# Patient Record
Sex: Male | Born: 1995 | Race: White | Hispanic: No | Marital: Single | Smoking: Never smoker
Health system: Southern US, Community
[De-identification: ages and names within clinical notes are randomized; demographics above are authoritative.]

## PROBLEM LIST (undated history)

## (undated) DIAGNOSIS — I82419 Acute embolism and thrombosis of unspecified femoral vein: Secondary | ICD-10-CM

## (undated) DIAGNOSIS — R760 Raised antibody titer: Secondary | ICD-10-CM

## (undated) HISTORY — PX: PENILE CYST REMOVAL: SHX2199

## (undated) HISTORY — DX: Acute embolism and thrombosis of unspecified femoral vein: I82.419

## (undated) HISTORY — DX: Raised antibody titer: R76.0

---
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
## 2014-06-21 ENCOUNTER — Encounter: Payer: Self-pay | Admitting: Family Medicine

## 2014-06-21 ENCOUNTER — Ambulatory Visit (HOSPITAL_COMMUNITY)
Admission: RE | Admit: 2014-06-21 | Discharge: 2014-06-21 | Disposition: A | Discharge status: Home / Self Care | Payer: BLUE CROSS/BLUE SHIELD | Source: Ambulatory Visit | Attending: Vascular Surgery | Admitting: Vascular Surgery

## 2014-06-21 ENCOUNTER — Ambulatory Visit (INDEPENDENT_AMBULATORY_CARE_PROVIDER_SITE_OTHER): Payer: BLUE CROSS/BLUE SHIELD | Admitting: Family Medicine

## 2014-06-21 VITALS — BP 135/80 | HR 88 | Temp 98.0°F | Resp 16 | Ht 73.0 in | Wt 177.0 lb

## 2014-06-21 DIAGNOSIS — R760 Raised antibody titer: Secondary | ICD-10-CM

## 2014-06-21 DIAGNOSIS — R21 Rash and other nonspecific skin eruption: Secondary | ICD-10-CM | POA: Diagnosis not present

## 2014-06-21 DIAGNOSIS — R7982 Elevated C-reactive protein (CRP): Secondary | ICD-10-CM | POA: Diagnosis not present

## 2014-06-21 DIAGNOSIS — M7989 Other specified soft tissue disorders: Secondary | ICD-10-CM

## 2014-06-21 DIAGNOSIS — M79605 Pain in left leg: Secondary | ICD-10-CM | POA: Diagnosis not present

## 2014-06-21 DIAGNOSIS — I82419 Acute embolism and thrombosis of unspecified femoral vein: Secondary | ICD-10-CM

## 2014-06-21 HISTORY — DX: Raised antibody titer: R76.0

## 2014-06-21 HISTORY — DX: Acute embolism and thrombosis of unspecified femoral vein: I82.419

## 2014-06-21 MED ORDER — APIXABAN 5 MG PO TABS: ORAL_TABLET | ORAL | Status: DC

## 2014-06-21 NOTE — Progress Notes (Signed)
Pre visit review using our clinic review tool, if applicable. No additional management support is needed unless otherwise documented below in the visit note. 

## 2014-06-21 NOTE — Progress Notes (Addendum)
Office Note 06/21/2014  CC:  Chief Complaint  Patient presents with  . Establish Care  . Leg Swelling    x 2 months, left leg, comes and go, no known injury   HPI:  Bryan Robbins is a 19 y.o. White male who is here with his father to establish care and discuss left leg swelling. Patient's most recent primary MD: [facility] pediatricians. Old records were not reviewed prior to or during today's visit.  He reports being in normal/well state of health until about 2 months ago when he began noticing left leg heaviness, crampy pain, easy fatigueability, from about the top of the left thigh all the way down into lower leg/calf.   He noted that the leg would swell significantly from top of thigh down into ankle if he walked for extended periods.  The leg felt firm to touch during these times.  Initially he noted no skin abnormalities, said there was no pitting when he pressed on his skin of his left leg.  No history of injury/trauma/strain to groin or left thigh/leg prior to onset of symptoms. No history of prolonged immobilization prior to the sx's, no recent surgical procedure, no personal or FH of DVT or PE.  He presented to his campus health clinic and was dx'd with "soft tissue injury/swelling" thought to be secondary to a possible injury while running a week or two prior.  However, he recalled no such injury. He was told to take a $Remo'325mg'avHrw$  aspirin once a day and he noted that the symptoms seemed to almost completely resolve after about a week of doing this.  However, he never felt like the leg completely returned to normal. Then, about 10 days ago his swelling and leg discomfort returned--and lately he has noted some skin changes. He has some scattered pinkish papules as well as some purplish/hyperpigmented macular lesions scattered on left upper and lower leg, primarily anterior aspect.   This prompted him to present to [facility] walk-in clinic 06/18/14.  Dx that was given is unclear to pt and  father, but he was rx'd a z pack and alleve bid was recommended.  He notes a small amount of improvement in the skin since getting on these meds, plus says a little of the swollen feeling in his left leg seems to have abated.    History reviewed. No pertinent past medical history. Healthy, no hospitalizations or procedures.  No chronic illnesses, no chronic meds. He has had appropriate WCCs growing up as well as appropriate vaccines per Dad's report today.  History reviewed. No pertinent past surgical history.  Family History  Problem Relation Age of Onset  . Hypertension Father   . Prostate cancer Paternal Uncle   . Lung cancer Maternal Grandmother   . Colon cancer Maternal Grandfather     History   Social History  . Marital Status: Single    Spouse Name: N/A  . Number of Children: N/A  . Years of Education: N/A   Occupational History  . Not on file.   Social History Main Topics  . Smoking status: Never Smoker   . Smokeless tobacco: Never Used  . Alcohol Use: Yes     Comment: occasionally  . Drug Use: No  . Sexual Activity: Not on file   Other Topics Concern  . Not on file   Social History Narrative   Single, no children.    student, junior-majoring in Ambulance person.   HS grad from [employer] in [place], [place].   No T/A/Ds.   MEDS: alleve 440 bid and azithromycin (has one more day of this med left to take)  Allergies  Allergen Reactions  . Penicillins Hives    ROS Review of Systems  Constitutional: Negative for fever, chills, appetite change and fatigue.  HENT: Negative for congestion, dental problem, ear pain, mouth sores, rhinorrhea and sore throat.   Eyes: Negative for discharge, redness and visual disturbance.  Respiratory: Negative for cough, chest tightness, shortness of breath and wheezing.   Cardiovascular: Negative for chest pain, palpitations and leg swelling.  Gastrointestinal: Negative for nausea, vomiting,  abdominal pain, diarrhea and blood in stool.  Genitourinary: Negative for dysuria, urgency, frequency, hematuria, flank pain, scrotal swelling and difficulty urinating.  Musculoskeletal: Negative for myalgias, back pain, joint swelling, arthralgias and neck stiffness.  Skin: Positive for rash (as per HPI). Negative for pallor.  Allergic/Immunologic: Negative for immunocompromised state.  Neurological: Negative for dizziness, speech difficulty, weakness and headaches.  Hematological: Negative for adenopathy. Does not bruise/bleed easily.  Psychiatric/Behavioral: Negative for confusion and sleep disturbance. The patient is not nervous/anxious.     PE; Blood pressure 135/80, pulse 88, temperature 98 F (36.7 C), temperature source Oral, resp. rate 16, height $RemoveBe'6\' 1"'EJlSFydJm$  (1.854 m), weight 177 lb (80.287 kg), SpO2 100 %. Gen: Alert, well appearing.  Patient is oriented to person, place, time, and situation. ZOX:WRUE: no injection, icteris, swelling, or exudate.  EOMI, PERRLA. Mouth: lips without lesion/swelling.  Oral mucosa pink and moist. Oropharynx without erythema, exudate, or swelling.  Neck - No masses or thyromegaly or limitation in range of motion CV: RRR, no m/r/g.   LUNGS: CTA bilat, nonlabored resps, good aeration in all lung fields. ABD: soft, NT, ND, BS normal.  No hepatospenomegaly or mass.  No bruits. Genitals normal; both testes normal without tenderness, masses, hydroceles, varicoceles, erythema or swelling. Shaft normal, circumcised, meatus normal without discharge. No inguinal hernia noted. No inguinal lymphadenopathy. Femoral pulses 2+ bilat.  EXT: Right LE w/out c/c/e or rash. R calf circ at 28 cm below inf border of patella is 27.5 cm L calf circ at 28 cm below inf border of patella is 30 cm. Left LE with no pitting edema and no obvious enlargement/swelling on general inspection. Knee and hip ROM fully intact and both of these joints are w/out tenderness.  No knee erythema,  warmth, or effusion.  LL's with 5/5 strength prox and dist bilat.  Patellar DTRs 2+ bilat and achilles DTRs 1+ bilat. No varicosities or areas of streaking or inflammation are noted on L leg. The skin of the left leg does have a dozen or so scattered pink papules, a couple of tiny pustules, and 15-20 oval-to-irregular shaped macular lesions that are non-blanching for the most part.  Some of the macules seem to blanch.  No palpable purpura.  No gluteal rash/lesions. Possibly a bit of pitting edema in left ankle but no ankle tenderness, and no sign of peri-articular inflammation or arthritis. Both feet feel a bit cool to touch, particularly on bottoms.   Pertinent labs:  Review of labs done at [facility] walkin clinic 06/18/14 show CMET all normal and CRP of 39.2.  ASSESSMENT AND PLAN:   New pt: will obtain appropriate prior PCP records.  1) Subacute left leg swelling with nonspecific skin eruption on the left leg. Certainly a perplexing case, esp since his leg exam here in the office today is pretty unremarkable except for the skin changes.  Will certainly check left  leg venous duplex doppler u/s ASAP to further eval for DVT. Additionally, given the skin changes and recent elevated CRP, I want to check several other lab tests to see if he has any sign of systemic inflammation such as a vasculitis (? HSP). Check CBC w/diff, CMET, CRP, ESR, ANA, d dimer, and UA with reflex microscopy.  He has an appt set up already to see a dermatologist tomorrow (Dr. Tonia Brooms in [place]), so I will defer the punch biopsy I had planned on doing today.    An After Visit Summary was printed and given to the patient.  Spent 50 min with pt today, with >50% of this time spent in counseling and care coordination regarding the above problems.  F/u: To be determined based on results of pending workup.  ADDENDUM 06/22/14: Ultrasound showed left leg proximal DVT. After phone consultation with Dr. Curt Jews with Vascular  surgery, I started patient on outpatient anticoagulation therapy: Eliquis rx'd and pt took first dose last evening.--PM

## 2014-06-22 LAB — COMPREHENSIVE METABOLIC PANEL WITH GFR
ALT: 14 U/L (ref 0–53)
AST: 14 U/L (ref 0–37)
Albumin: 4.7 g/dL (ref 3.5–5.2)
Alkaline Phosphatase: 100 U/L (ref 39–117)
BUN: 14 mg/dL (ref 6–23)
CO2: 30 meq/L (ref 19–32)
Calcium: 9.9 mg/dL (ref 8.4–10.5)
Chloride: 102 meq/L (ref 96–112)
Creatinine, Ser: 1.09 mg/dL (ref 0.40–1.50)
GFR: 91.31 mL/min
Glucose, Bld: 95 mg/dL (ref 70–99)
Potassium: 4.7 meq/L (ref 3.5–5.1)
Sodium: 139 meq/L (ref 135–145)
Total Bilirubin: 0.4 mg/dL (ref 0.2–1.2)
Total Protein: 8.3 g/dL (ref 6.0–8.3)

## 2014-06-22 LAB — CBC WITH DIFFERENTIAL/PLATELET
Basophils Absolute: 0 10*3/uL (ref 0.0–0.1)
Basophils Relative: 0.3 % (ref 0.0–3.0)
EOS ABS: 0.1 10*3/uL (ref 0.0–0.7)
Eosinophils Relative: 1.4 % (ref 0.0–5.0)
HEMATOCRIT: 42.8 % (ref 39.0–52.0)
Hemoglobin: 14.1 g/dL (ref 13.0–17.0)
LYMPHS PCT: 18.3 % (ref 12.0–46.0)
Lymphs Abs: 1.9 10*3/uL (ref 0.7–4.0)
MCHC: 32.9 g/dL (ref 30.0–36.0)
MCV: 89.8 fl (ref 78.0–100.0)
MONOS PCT: 3 % (ref 3.0–12.0)
Monocytes Absolute: 0.3 10*3/uL (ref 0.1–1.0)
NEUTROS ABS: 7.9 10*3/uL — AB (ref 1.4–7.7)
Neutrophils Relative %: 77 % (ref 43.0–77.0)
PLATELETS: 243 10*3/uL (ref 150.0–400.0)
RBC: 4.77 Mil/uL (ref 4.22–5.81)
RDW: 13.6 % (ref 11.5–14.6)
WBC: 10.3 10*3/uL (ref 4.5–10.5)

## 2014-06-22 LAB — URINALYSIS, ROUTINE W REFLEX MICROSCOPIC
Bilirubin Urine: NEGATIVE
Hgb urine dipstick: NEGATIVE
Ketones, ur: NEGATIVE
Leukocytes, UA: NEGATIVE
Nitrite: NEGATIVE
Specific Gravity, Urine: <=1.005 — AB
Total Protein, Urine: NEGATIVE
Urine Glucose: NEGATIVE
Urobilinogen, UA: 0.2
pH: 7 (ref 5.0–8.0)

## 2014-06-22 LAB — C-REACTIVE PROTEIN: CRP: 3.5 mg/dL (ref 0.5–20.0)

## 2014-06-22 LAB — D-DIMER, QUANTITATIVE (NOT AT ARMC): D DIMER QUANT: 0.45 ug{FEU}/mL (ref 0.00–0.48)

## 2014-06-22 LAB — SEDIMENTATION RATE: SED RATE: 29 mm/h — AB (ref 0–22)

## 2014-06-23 ENCOUNTER — Other Ambulatory Visit: Payer: Self-pay | Admitting: Family Medicine

## 2014-06-23 ENCOUNTER — Telehealth: Payer: Self-pay

## 2014-06-23 DIAGNOSIS — I82402 Acute embolism and thrombosis of unspecified deep veins of left lower extremity: Secondary | ICD-10-CM

## 2014-06-23 LAB — RHEUMATOID FACTOR

## 2014-06-23 NOTE — Telephone Encounter (Signed)
[facility] lab called stating that they didn't receive a specimen to run the Rheumatoid Factor on his blood work.

## 2014-06-23 NOTE — Telephone Encounter (Signed)
I was aware

## 2014-06-26 ENCOUNTER — Telehealth: Payer: Self-pay | Admitting: Family Medicine

## 2014-06-26 NOTE — Telephone Encounter (Signed)
Copy of labs put up front for p/u.

## 2014-06-26 NOTE — Telephone Encounter (Signed)
Patient's father would like to pick up a copy of patient's bloodwork results

## 2014-06-28 ENCOUNTER — Telehealth: Payer: Self-pay | Admitting: Hematology & Oncology

## 2014-06-28 NOTE — Telephone Encounter (Signed)
I spoke w NEW PATIENT father (Daniel) todReuel Boomay to remind them of their appointment with Dr. Myna HidalgoEnnever. Also, advised them to bring all medication bottles and insurance card information.

## 2014-06-30 ENCOUNTER — Other Ambulatory Visit: Payer: Self-pay | Admitting: *Deleted

## 2014-06-30 DIAGNOSIS — I82409 Acute embolism and thrombosis of unspecified deep veins of unspecified lower extremity: Secondary | ICD-10-CM

## 2014-07-05 ENCOUNTER — Ambulatory Visit: Payer: BLUE CROSS/BLUE SHIELD

## 2014-07-05 ENCOUNTER — Encounter: Payer: Self-pay | Admitting: Family

## 2014-07-05 ENCOUNTER — Ambulatory Visit (HOSPITAL_BASED_OUTPATIENT_CLINIC_OR_DEPARTMENT_OTHER): Payer: BLUE CROSS/BLUE SHIELD | Admitting: Family

## 2014-07-05 VITALS — BP 124/69 | HR 120 | Temp 98.3°F | Resp 18 | Ht 73.0 in | Wt 177.0 lb

## 2014-07-05 DIAGNOSIS — I82422 Acute embolism and thrombosis of left iliac vein: Secondary | ICD-10-CM

## 2014-07-05 DIAGNOSIS — I8290 Acute embolism and thrombosis of unspecified vein: Secondary | ICD-10-CM

## 2014-07-05 DIAGNOSIS — I82409 Acute embolism and thrombosis of unspecified deep veins of unspecified lower extremity: Secondary | ICD-10-CM

## 2014-07-05 NOTE — Progress Notes (Signed)
Hematology/Oncology Consultation   Name: Bryan Robbins      MRN: [id number]    Location: Room/bed info not found  Date: 07/05/2014 Time:9:00 AM   REFERRING PHYSICIAN: Jeoffrey Massed, MD  REASON FOR CONSULT: DVT of the left lower extremity   DIAGNOSIS: DVT of the left lower extremity  HISTORY OF PRESENT ILLNESS: 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 This is his first blood clots. He started noticing swelling and "heaviness" in his left leg 3 months ago. He had just returned from [place] in a cramped car with a bunch of his friends. An [place] on 6/1 confirmed the DVT and he was started on Eliquis.  He has had no episodes of bruising or bleeding while on anticoagulation. The swelling in the left leg has resolved and he has not experienced any pain.  There is no family history of blood clots. However, his mother did have to miscarriages. We checked a hypercoagulable panel on him today and are awaiting results.  He does not smoke, drink heavily or do recreational drugs.  No cancer history.  He has had no problem with infections. No fever, chills, n/v, cough, rash, dizziness, blurred vision, headaches, SOB, chest pain, palpitations, abdominal pain, change in bowel or bladder habits, blood in urine or stool.  No numbness or tingling in his extremities.No new aches or pains.  His appetite is good and he is staying well hydrated. No weight loss or gain.  He is a Consulting civil engineer at [employer]. He is active running and doing various other outdoor activities.   ROS: All other 10 point review of systems is negative.   PAST MEDICAL HISTORY:   No past medical history on file.  ALLERGIES: Allergies  Allergen Reactions  . Penicillins Hives      MEDICATIONS:  Current Outpatient Prescriptions on File Prior to Visit  Medication Sig Dispense Refill  . apixaban (ELIQUIS) 5 MG TABS  tablet 2 tabs po bid x 7d, then 1 tab po bid 70 tablet 0  . azithromycin (ZITHROMAX) 250 MG tablet Take 250 mg by mouth. Take 2 tablet the first day then take 1 tablet til finished    . naproxen sodium (ANAPROX) 220 MG tablet Take 220 mg by mouth 2 (two) times daily with a meal.     No current facility-administered medications on file prior to visit.     PAST SURGICAL HISTORY No past surgical history on file.  FAMILY HISTORY: Family History  Problem Relation Age of Onset  . Hypertension Father   . Prostate cancer Paternal Uncle   . Lung cancer Maternal Grandmother   . Colon cancer Maternal Grandfather     SOCIAL HISTORY:  reports that he has never smoked. He has never used smokeless tobacco. He reports that he drinks alcohol. He reports that he does not use illicit drugs.  PERFORMANCE STATUS: The patient's performance status is 1 - Symptomatic but completely ambulatory  PHYSICAL EXAM: Most Recent Vital Signs: There were no vitals taken for this visit. BP 124/69 mmHg  Pulse 120  Temp(Src) 98.3 F (36.8 C) (Oral)  Resp 18  Ht  (1.854 m)  Wt 177 lb (80.287 kg)  BMI 23.36 kg/m2  General Appearance:    Alert, cooperative, no distress, appears stated age  Head:    Normocephalic, without obvious abnormality, atraumatic  Eyes:    PERRL,  conjunctiva/corneas clear, EOM's intact, fundi    benign, both eyes             Throat:   Lips, mucosa, and tongue normal; teeth and gums normal  Neck:   Supple, symmetrical, trachea midline, no adenopathy;       thyroid:  No enlargement/tenderness/nodules; no carotid   bruit or JVD  Back:     Symmetric, no curvature, ROM normal, no CVA tenderness  Lungs:     Clear to auscultation bilaterally, respirations unlabored  Chest wall:    No tenderness or deformity  Heart:    Regular rate and rhythm, S1 and S2 normal, no murmur, rub   or gallop  Abdomen:     Soft, non-tender, bowel sounds active all four quadrants,    no masses, no  organomegaly        Extremities:   Extremities normal, atraumatic, no cyanosis or edema  Pulses:   2+ and symmetric all extremities  Skin:   Skin color, texture, turgor normal, no rashes or lesions  Lymph nodes:   Cervical, supraclavicular, and axillary nodes normal  Neurologic:   CNII-XII intact. Normal strength, sensation and reflexes      throughout   LABORATORY DATA:  No results found for this or any previous visit (from the past 48 hour(s)).    RADIOGRAPHY: No results found.     PATHOLOGY: None  ASSESSMENT/PLAN:  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 This is his first blood clot diagnosed on 6/1 by [place]. He is currently on Eliquis BID and has noticed a significant decrease in swelling and discomfort.  He had no bruising or episodes of bleeding while on anticoagulation.  We will see what his hypercoagulable panel shows. It is interesting that his mother had 2 miscarriages. This may be related.  He may also have May -Thurner Syndrome with him being so young and the DVT having appeared in the left leg. We will contact radiology and find the least invasive method to assess for this.  We will set up his follow-up after receiving his lab results from today.  All questions were answered. Both he and his father know to contact us with any problems, questions or concerns. We can certainly see him much sooner if necessary.  The patient was discussed with and also seen by Dr. Myna Hidalgo and he is in agreement with the aforementioned.   [facility]    Addendum:  I saw and examined the patient with Zeth Buday. I do find it very interesting that his mother had 2 miscarriages. That certainly could indicate some hereditary hypercoagulable condition. We will see what his hypercoagulable panel shows.  I also need to evaluate him for iliac vein compression due to the May-Thurner syndrome. With a  young patient like [name], this would be a possible etiology for a thromboembolic event in the left leg.  I cannot see anything on his blood smear that looked suspicious.  We spent about 45 minutes with he and his dad. They're both very nice.  We will set him up with a CT venogram of the abdomen and pelvis. I spoke with one of the radiologist who gave me the recommendation for doing the venogram to identify any iliac vein compression.  We will probably get him back to see [place] in about 4-6 weeks area did repot need to make sure we get  him back for a goes back to college at [employer]  Cindee Lame

## 2014-07-06 ENCOUNTER — Telehealth: Payer: Self-pay | Admitting: Hematology & Oncology

## 2014-07-06 NOTE — Telephone Encounter (Signed)
Spoke with pt's fathers regarding ct appt date, time, and instructions. Father confirmed pick up of contrast and appt.

## 2014-07-09 LAB — HYPERCOAGULABLE PANEL, COMPREHENSIVE
AntiThromb III Func: 105 % (ref 76–126)
Anticardiolipin IgA: 5 APL U/mL (ref <22)
Anticardiolipin IgG: 15 GPL U/mL (ref <23)
Anticardiolipin IgM: 2 MPL U/mL (ref <11)
BETA-2-GLYCOPROTEIN I IGM: 6 M Units (ref <20)
Beta-2 Glyco I IgG: 5 G Units (ref <20)
Beta-2-Glycoprotein I IgA: 17 A Units (ref <20)
DRVVT 1 1 MIX: 44.2 s — AB (ref <42.9)
DRVVT: 60.9 secs — ABNORMAL HIGH (ref <42.9)
Drvvt confirmation: 1.23 Ratio — ABNORMAL HIGH (ref <1.15)
Lupus Anticoagulant: DETECTED — AB
PROTEIN C ACTIVITY: 120 % (ref 75–133)
PROTEIN C, TOTAL: 77 % (ref 72–160)
PTT Lupus Anticoagulant: 47.5 secs — ABNORMAL HIGH (ref 28.0–43.0)
PTTLA 41 MIX: 47.9 s — AB (ref 28.0–43.0)
PTTLA CONFIRMATION: 5.4 s (ref <8.0)
Protein S Activity: 120 % (ref 69–129)
Protein S Total: 93 % (ref 60–150)

## 2014-07-09 LAB — ANTI-DNA ANTIBODY, DOUBLE-STRANDED: ds DNA Ab: 4 IU/mL

## 2014-07-09 LAB — ANA: ANA: NEGATIVE

## 2014-07-09 LAB — ANTI-SMITH ANTIBODY: ENA SM Ab Ser-aCnc: <1

## 2014-07-10 ENCOUNTER — Encounter (HOSPITAL_BASED_OUTPATIENT_CLINIC_OR_DEPARTMENT_OTHER): Payer: Self-pay

## 2014-07-10 ENCOUNTER — Ambulatory Visit (HOSPITAL_BASED_OUTPATIENT_CLINIC_OR_DEPARTMENT_OTHER)
Admission: RE | Admit: 2014-07-10 | Discharge: 2014-07-10 | Disposition: A | Discharge status: Home / Self Care | Payer: BLUE CROSS/BLUE SHIELD | Source: Ambulatory Visit | Attending: Hematology & Oncology | Admitting: Hematology & Oncology

## 2014-07-10 ENCOUNTER — Other Ambulatory Visit: Payer: Self-pay | Admitting: Hematology & Oncology

## 2014-07-10 DIAGNOSIS — I82412 Acute embolism and thrombosis of left femoral vein: Secondary | ICD-10-CM | POA: Diagnosis not present

## 2014-07-10 DIAGNOSIS — I82422 Acute embolism and thrombosis of left iliac vein: Secondary | ICD-10-CM | POA: Diagnosis present

## 2014-07-10 DIAGNOSIS — I8290 Acute embolism and thrombosis of unspecified vein: Secondary | ICD-10-CM

## 2014-07-10 MED ORDER — IOHEXOL 350 MG/ML SOLN
100.0000 mL | Freq: Once | INTRAVENOUS | Status: AC | PRN
  Administered 2014-07-10: 100 mL via INTRAVENOUS

## 2014-07-12 ENCOUNTER — Telehealth: Payer: Self-pay | Admitting: *Deleted

## 2014-07-12 NOTE — Telephone Encounter (Addendum)
Patient aware of results.  ----- Message from Josph Macho, MD sent at 07/11/2014  5:13 PM EDT ----- Call - CT angiogram does not show any May-Thurner syndrome abnormality.  The iliac vein is slightly compressed, but not significant.  They actually saw very thrombus in the left femoral vein!!!   Bryan Robbins

## 2014-07-13 LAB — HEXAGONAL PHOSPHOLIPID NEUTRALIZATION: HEX PHOSPH NEUT TEST: POSITIVE — AB

## 2014-07-14 ENCOUNTER — Telehealth: Payer: Self-pay | Admitting: Family

## 2014-07-14 ENCOUNTER — Other Ambulatory Visit: Payer: Self-pay | Admitting: Family

## 2014-07-14 MED ORDER — APIXABAN 5 MG PO TABS: 5.0000 mg | ORAL_TABLET | Freq: Two times a day (BID) | ORAL | Status: DC

## 2014-07-14 NOTE — Telephone Encounter (Signed)
I spoke with the patient's father, Bryan Robbins, over the phone and went over his blood work and scan results. We discussed the positive lupus anticoagulant and how it effect's Bryan Robbins's tendency to clot. I refilled his Eliquis prescription. I will check with Dr. Myna Hidalgo on Monday and see when he would like a follow-up.

## 2014-07-18 ENCOUNTER — Telehealth: Payer: Self-pay | Admitting: Family

## 2014-07-18 ENCOUNTER — Other Ambulatory Visit: Payer: Self-pay | Admitting: Family

## 2014-07-18 DIAGNOSIS — I82402 Acute embolism and thrombosis of unspecified deep veins of left lower extremity: Secondary | ICD-10-CM

## 2014-07-18 NOTE — Telephone Encounter (Signed)
Just spoke with Bryan Robbins over the phone and let him know that Dr. Myna HidalgoEnnever would also like him to be taking an 81 mg baby aspirin daily along with the Eliquis. We will set him up for a follow-up in 1 month.

## 2014-07-19 ENCOUNTER — Telehealth: Payer: Self-pay | Admitting: Hematology & Oncology

## 2014-07-19 NOTE — Telephone Encounter (Signed)
Called and could not leave message regarding future July appt mailed calendar

## 2014-07-20 ENCOUNTER — Telehealth: Payer: Self-pay

## 2014-07-20 NOTE — Telephone Encounter (Signed)
Gaston Islamaniel Pauli Dad  [facility]

## 2014-07-31 ENCOUNTER — Telehealth: Payer: Self-pay | Admitting: *Deleted

## 2014-07-31 ENCOUNTER — Other Ambulatory Visit: Payer: Self-pay | Admitting: *Deleted

## 2014-07-31 DIAGNOSIS — R0602 Shortness of breath: Secondary | ICD-10-CM

## 2014-07-31 NOTE — Telephone Encounter (Signed)
Patient called stating that he has been having right sided chest tightness. It is constant and is described as an achy pain.  Dr. Myna HidalgoEnnever ordered CT angiogram.  Orders placed .  Patient notified

## 2014-08-01 ENCOUNTER — Ambulatory Visit (HOSPITAL_BASED_OUTPATIENT_CLINIC_OR_DEPARTMENT_OTHER)
Admission: RE | Admit: 2014-08-01 | Discharge: 2014-08-01 | Disposition: A | Discharge status: Home / Self Care | Payer: BLUE CROSS/BLUE SHIELD | Source: Ambulatory Visit | Attending: Hematology & Oncology | Admitting: Hematology & Oncology

## 2014-08-01 ENCOUNTER — Encounter (HOSPITAL_BASED_OUTPATIENT_CLINIC_OR_DEPARTMENT_OTHER): Payer: Self-pay

## 2014-08-01 DIAGNOSIS — R0602 Shortness of breath: Secondary | ICD-10-CM

## 2014-08-01 DIAGNOSIS — R079 Chest pain, unspecified: Secondary | ICD-10-CM | POA: Diagnosis not present

## 2014-08-01 MED ORDER — IOHEXOL 350 MG/ML SOLN
100.0000 mL | Freq: Once | INTRAVENOUS | Status: AC | PRN
  Administered 2014-08-01: 100 mL via INTRAVENOUS

## 2014-08-02 ENCOUNTER — Encounter: Payer: Self-pay | Admitting: Family Medicine

## 2014-08-02 ENCOUNTER — Telehealth: Payer: Self-pay | Admitting: *Deleted

## 2014-08-02 ENCOUNTER — Ambulatory Visit (INDEPENDENT_AMBULATORY_CARE_PROVIDER_SITE_OTHER): Payer: BLUE CROSS/BLUE SHIELD | Admitting: Family Medicine

## 2014-08-02 VITALS — BP 136/78 | HR 86 | Temp 97.4°F | Resp 16 | Ht 73.0 in | Wt 178.0 lb

## 2014-08-02 DIAGNOSIS — I82412 Acute embolism and thrombosis of left femoral vein: Secondary | ICD-10-CM | POA: Diagnosis not present

## 2014-08-02 DIAGNOSIS — R072 Precordial pain: Secondary | ICD-10-CM

## 2014-08-02 DIAGNOSIS — I82419 Acute embolism and thrombosis of unspecified femoral vein: Secondary | ICD-10-CM | POA: Insufficient documentation

## 2014-08-02 MED ORDER — RANITIDINE HCL 150 MG PO CAPS: 150.0000 mg | ORAL_CAPSULE | Freq: Two times a day (BID) | ORAL | Status: DC

## 2014-08-02 NOTE — Progress Notes (Signed)
Pre visit review using our clinic review tool, if applicable. No additional management support is needed unless otherwise documented below in the visit note. 

## 2014-08-02 NOTE — Progress Notes (Signed)
OFFICE NOTE  08/02/2014  CC:  Chief Complaint  Patient presents with  . Chest Pain    pressure right side of chest x 6 days   HPI: Patient is a 19 y.o. Caucasian male who is here for chest pressure. Onset about 6d/a, feels a pressure on R upper pectoral area, worse with deep breath, feels like he has trouble getting a deep inspiration in.  No SOB or rapid breathing, no cough or fevers or URI sx's.  He started ASA 81mg  qd 2 wks ago and drinks glass of water with it but takes it on empty stomach almost every time.  No other NSAIDs.  He notified Dr. Myna HidalgoEnnever of these sx's and he got a CT chest angio on 08/01/14 and this showed NO PE or other abnormality.  Has had some heartburn/reflux type sx's a few times during the last week. A bit more need to clear some mucous from his throat lately.  No chest trauma. The pain is occ a sharp pain fleetingly, 2-3 times a day.  The pain sometimes extends into R shoulder/upper arm area.    Pertinent PMH:  Past Medical History  Diagnosis Date  . 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  MEDS:  Outpatient Prescriptions Prior to Visit  Medication Sig Dispense Refill  . apixaban (ELIQUIS) 5 MG TABS tablet Take 1 tablet (5 mg total) by mouth 2 (two) times daily. 60 tablet 3   No facility-administered medications prior to visit.    PE: Blood pressure 136/78, pulse 86, temperature 97.4 F (36.3 C), temperature source Oral, resp. rate 16, height 6\' 1"  (1.854 m), weight 178 lb (80.74 kg), SpO2 100 %. Gen: Alert, well appearing.  Patient is oriented to person, place, time, and situation. WUJ:WJXBENT:Eyes: no injection, icteris, swelling, or exudate.  EOMI, PERRLA. Mouth: lips without lesion/swelling.  Oral mucosa pink and moist. Oropharynx without erythema, exudate, or swelling.  Neck - No masses or thyromegaly or limitation  in range of motion.  No bruits in carotid, supraclavicular, or infraclavicular areas.  CV: RRR, no m/r/g.   LUNGS: CTA bilat, nonlabored resps, good aeration in all lung fields. Chest wall: no TTP.  EKG: NSR, rate 89, RSR' in V1, no ischemic signs, no ectopy.  Normal durations and intervals.  No LVH.  IMPRESSION AND PLAN:  Chest pressure/atypical chest pain.   EKG reassuring.  Recent CT angio chest NEG. I think he has a combo of anxiety and GERD/esophagitis causing his current sx's. I'll start him on zantac 150mg  bid, recommend he take his ASA with food in stomach + water. Discussed some general coping mechanisms for his anxiety.  He admitted that he does feel like he has almost had a panic attack on a couple of occasions since getting the dx of DVT in L leg about 6 wks ago.  An After Visit Summary was printed and given to the patient.  FOLLOW UP:  3 wks

## 2014-08-02 NOTE — Telephone Encounter (Addendum)
Patient aware of results  ----- Message from Josph MachoPeter R Ennever, MD sent at 08/02/2014  7:08 AM EDT ----- Call - No blood clot in the lungs!!!  Bryan Robbins

## 2014-08-10 ENCOUNTER — Telehealth: Payer: Self-pay | Admitting: *Deleted

## 2014-08-10 MED ORDER — SCOPOLAMINE 1 MG/3DAYS TD PT72: MEDICATED_PATCH | TRANSDERMAL | Status: DC

## 2014-08-10 NOTE — Telephone Encounter (Signed)
Pts father Antonieta Iba stating that pt is about to go on a trip to the [place] and will be doing a fishing expedition. Pts father wanted to know if it will be okay for pt to take Claritin, Dramamine and/or scopolamine patch (need Rx if okay). He wasn't sure if pt could take these with his regular medications. Please advise. Thanks.

## 2014-08-10 NOTE — Telephone Encounter (Signed)
Yes, claritin, dramamine, and scopolamine are all fine. I sent in rx for scopolamine patches.

## 2014-08-10 NOTE — Telephone Encounter (Signed)
Pts father advised and voiced understanding.  

## 2014-08-17 ENCOUNTER — Ambulatory Visit (HOSPITAL_BASED_OUTPATIENT_CLINIC_OR_DEPARTMENT_OTHER): Payer: BLUE CROSS/BLUE SHIELD | Admitting: Nurse Practitioner

## 2014-08-17 ENCOUNTER — Ambulatory Visit (HOSPITAL_BASED_OUTPATIENT_CLINIC_OR_DEPARTMENT_OTHER): Payer: BLUE CROSS/BLUE SHIELD | Admitting: Hematology & Oncology

## 2014-08-17 DIAGNOSIS — I82412 Acute embolism and thrombosis of left femoral vein: Secondary | ICD-10-CM

## 2014-08-17 DIAGNOSIS — D6862 Lupus anticoagulant syndrome: Secondary | ICD-10-CM

## 2014-08-17 DIAGNOSIS — I82402 Acute embolism and thrombosis of unspecified deep veins of left lower extremity: Secondary | ICD-10-CM | POA: Diagnosis not present

## 2014-08-17 LAB — CBC WITH DIFFERENTIAL (CANCER CENTER ONLY)
BASO#: 0 10*3/uL (ref 0.0–0.2)
BASO%: 0.2 % (ref 0.0–2.0)
EOS%: 4.1 % (ref 0.0–7.0)
Eosinophils Absolute: 0.2 10*3/uL (ref 0.0–0.5)
HCT: 39.7 % (ref 38.7–49.9)
HEMOGLOBIN: 14 g/dL (ref 13.0–17.1)
LYMPH#: 1.5 10*3/uL (ref 0.9–3.3)
LYMPH%: 33.4 % (ref 14.0–48.0)
MCH: 30.4 pg (ref 28.0–33.4)
MCHC: 35.3 g/dL (ref 32.0–35.9)
MCV: 86 fL (ref 82–98)
MONO#: 0.3 10*3/uL (ref 0.1–0.9)
MONO%: 6.6 % (ref 0.0–13.0)
NEUT%: 55.7 % (ref 40.0–80.0)
NEUTROS ABS: 2.6 10*3/uL (ref 1.5–6.5)
Platelets: 166 10*3/uL (ref 145–400)
RBC: 4.6 10*6/uL (ref 4.20–5.70)
RDW: 12.2 % (ref 11.1–15.7)
WBC: 4.6 10*3/uL (ref 4.0–10.0)

## 2014-08-17 NOTE — Progress Notes (Signed)
Hematology and Oncology Follow Up Visit  Bryan Robbins [id number] 07-Jul-1995 19 y.o. 08/17/2014   Principle Diagnosis:   Left lower extremity thromboembolic disease  Positive lupus anticoagulant  Current Therapy:    ELIQUIS 5 mg by mouth twice a day  Aspirin 81 mg by mouth daily     Interim History:  [name] is back for follow-up. This is his second office visit. We first saw him back in mid June.  We saw him, we did a venogram of his abdomen and pelvis. He did not have an abnormality that would put him a category of the May-Thurner syndrome.  We also did a CT angiogram of the chest. This was negative for any pulmonary embolism.  His hypercoagulable studies did show a positive lupus anticoagulant that was confirmed. All of his other coagulation studies were negative. As such , I put him on a baby aspirin.  He goes back to college in about 2 weeks.  He has family will go on a trip to the [employer] next week. I told him to make sure that he does not dehydrated that he drinks a lot of fluid.  I reiterated to him that he must take the aspirin on a full stomach and take it during the daytime.  He says his left leg is feeling better. He does wear compression stocking.  He's had no cough. He's has intermittent chest wall discomfort.  He's had no change in bowel or bladder habits.      Medications:  Current outpatient prescriptions:  .  apixaban (ELIQUIS) 5 MG TABS tablet, Take 1 tablet (5 mg total) by mouth 2 (two) times daily., Disp: 60 tablet, Rfl: 3 .  aspirin 81 MG tablet, Take 81 mg by mouth daily., Disp: , Rfl:  .  ranitidine (ZANTAC) 150 MG capsule, Take 1 capsule (150 mg total) by mouth 2 (two) times daily., Disp: 60 capsule, Rfl: 3  Allergies:  Allergies  Allergen Reactions  . Penicillins Hives    Past Medical History, Surgical history, Social history, and Family History were reviewed and updated.  Review of Systems: As above  Physical Exam:  vitals  were not taken for this visit.  Wt Readings from Last 3 Encounters:  08/17/14 179 lb (81.194 kg)  08/02/14 178 lb (80.74 kg)  07/05/14 177 lb (80.287 kg)     Well-developed and well-nourished white male in no obvious distress. Vital signs are stable. Head and neck exam shows no ocular or oral lesions. There are no palpable cervical or supraclavicular lymph nodes. Lungs are clear. Cardiac exam regular rate and rhythm with no murmurs, rubs or bruits. Abdomen is soft. Has good bowel sounds. There is no fluid wave. There is no palpable liver or spleen tip. Back exam shows no tenderness over the spine, ribs or hips. Extremities shows no clubbing, cyanosis or edema. No venous cord is noted in the left leg. Has a negative Homans sign. He has good pulses in his distal extremities. Skin exam shows no rashes, ecchymoses or petechia. Neurological exam is nonfocal.  Lab Results  Component Value Date   WBC 4.6 08/17/2014   HGB 14.0 08/17/2014   HCT 39.7 08/17/2014   MCV 86 08/17/2014   PLT 166 08/17/2014     Chemistry      Component Value Date/Time   NA 139 06/21/2014 1602   K 4.7 06/21/2014 1602   CL 102 06/21/2014 1602   CO2 30 06/21/2014 1602   BUN 14 06/21/2014 1602  CREATININE 1.09 06/21/2014 1602      Component Value Date/Time   CALCIUM 9.9 06/21/2014 1602   ALKPHOS 100 06/21/2014 1602   AST 14 06/21/2014 1602   ALT 14 06/21/2014 1602   BILITOT 0.4 06/21/2014 1602         Impression and Plan: [name] is a 19 year old white male. He has a thrombus in the left leg. He does have a positive lupus anticoagulant. I told him that is probable that the lupus anticoagulant will be intermittent. However, I think they probably will need aspirin lifelong.  As far as duration of ELIQUIS, I think that he probably will need 6 months. I think this and be very reasonable.  He will come back to see [place] on his fall break. This will be October 7. I'll see him back that day and do a Doppler  also.  I spent about 30 minutes with he and his dad. I went over all the lab work. I went over my recommendations. They agree.   Josph Macho, MD 7/28/20169:49 AM

## 2014-08-24 ENCOUNTER — Telehealth: Payer: Self-pay | Admitting: Family Medicine

## 2014-08-24 ENCOUNTER — Ambulatory Visit (INDEPENDENT_AMBULATORY_CARE_PROVIDER_SITE_OTHER): Payer: BLUE CROSS/BLUE SHIELD | Admitting: Family Medicine

## 2014-08-24 ENCOUNTER — Encounter: Payer: Self-pay | Admitting: Family Medicine

## 2014-08-24 ENCOUNTER — Telehealth: Payer: Self-pay | Admitting: *Deleted

## 2014-08-24 ENCOUNTER — Ambulatory Visit (HOSPITAL_BASED_OUTPATIENT_CLINIC_OR_DEPARTMENT_OTHER)
Admission: RE | Admit: 2014-08-24 | Discharge: 2014-08-24 | Disposition: A | Discharge status: Home / Self Care | Payer: BLUE CROSS/BLUE SHIELD | Source: Ambulatory Visit | Attending: Family Medicine | Admitting: Family Medicine

## 2014-08-24 VITALS — BP 119/80 | HR 119 | Temp 99.0°F | Resp 16 | Ht 73.0 in | Wt 180.0 lb

## 2014-08-24 DIAGNOSIS — D68312 Antiphospholipid antibody with hemorrhagic disorder: Secondary | ICD-10-CM | POA: Diagnosis not present

## 2014-08-24 DIAGNOSIS — I82411 Acute embolism and thrombosis of right femoral vein: Secondary | ICD-10-CM | POA: Diagnosis not present

## 2014-08-24 DIAGNOSIS — M7989 Other specified soft tissue disorders: Secondary | ICD-10-CM

## 2014-08-24 DIAGNOSIS — I82412 Acute embolism and thrombosis of left femoral vein: Secondary | ICD-10-CM | POA: Insufficient documentation

## 2014-08-24 NOTE — Progress Notes (Signed)
Pre visit review using our clinic review tool, if applicable. No additional management support is needed unless otherwise documented below in the visit note. 

## 2014-08-24 NOTE — Telephone Encounter (Signed)
I have attempted to call patient on his mobile phone and his home phone this afternoon, concerning his lower extremity ultrasound results. If patient would call back please tell him that he does not have a new clot formation in his right leg. Please attempt to call him again until we reach him, or he calls back. Thank you

## 2014-08-24 NOTE — Assessment & Plan Note (Signed)
You have an appointment at [facility] for ultrasound at 2:30 today. Check in at the main entrance.

## 2014-08-24 NOTE — Progress Notes (Signed)
Subjective:    Patient ID: Bryan Robbins, male    DOB: [birth year], 19 y.o.   MRN: [id number]  HPI  Right leg pain: Patient presents for acute office visit for right leg pain of one-week duration. Patient has recently been diagnosed with lupus anticoagulate 2 months ago, secondary to DVT of his left leg. Patient was placed on aspirin 81 mg and takes apixaban 5 mg by mouth 2 times a day. He is following closely with hematologist. Patient denies fevers, chest pain, shortness of breath or palpitations. Patient endorses compliance with his medications, and no missed doses. Patient states that he feels his symptoms are consistent with how he felt with his left leg when it was found to DVT. Reports sharp tinge-like pain in his right posterior thigh, mild swelling in his right posterior knee (intially, but resolved), intermittent pain from posterior knee to medial calf. Patient reports it is tender to touch his calf. Patient recently went on to long distance trips, in which she states that the car ride was 12 hours each direction, and his leg started hurting after.   Nonsmoker Past Medical History  Diagnosis Date  . 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 Lupus anticoagulant positive 06/2014    ASA 81mg  started on top of his eliquis   Allergies  Allergen Reactions  . Penicillins Hives   No past surgical history on file. Family History  Problem Relation Age of Onset  . Hypertension Father   . Prostate cancer Paternal Uncle   . Lung cancer Maternal Grandmother   . Colon cancer Maternal Grandfather    History   Social History  . Marital Status: Single    Spouse Name: N/A  . Number of Children: N/A  . Years of Education: N/A   Occupational History  . Not on file.   Social History Main Topics  . Smoking status: Never Smoker   . Smokeless tobacco: Never Used     Comment: NEVER USED TOBACCO  . Alcohol Use: 0.0 oz/week    0 Standard drinks or equivalent per  week     Comment: occasionally  . Drug Use: No  . Sexual Activity: Not on file   Other Topics Concern  . Not on file   Social History Narrative   Single, no children.   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   HS grad from [employer] in [place], [place].   No T/A/Ds.   Review of Systems  Constitutional: Negative for fever, chills, activity change, appetite change and fatigue.  Respiratory: Negative for cough, chest tightness and shortness of breath.   Cardiovascular: Positive for leg swelling. Negative for chest pain and palpitations.  Gastrointestinal: Negative for abdominal pain.  Musculoskeletal: Positive for myalgias. Negative for gait problem.  Skin: Negative for color change.  Neurological: Negative for syncope and numbness.      Objective:   Physical Exam BP 119/80 mmHg  Pulse 119  Temp(Src) 99 F (37.2 C) (Temporal)  Resp 16  Ht 6\' 1"  (1.854 m)  Wt 180 lb (81.647 kg)  BMI 23.75 kg/m2  SpO2 100% Gen: NAD. Nontoxic in appearance, well-developed, well-nourished, athletic appearing, Caucasian male Abd: Soft. NTND. No Masses palpated.  Right lower extremity: No erythema. No swelling. No tenderness to palpation posterior thigh, posterior knee. Mild tenderness to palpation medial calf. Mild ropiness medial calf. Negative Homans. Neurovascularly intact distally. Skin: No bruising, no erythema. No rashes, purpura or petechiae.  Assessment & Plan:  [name] is a 19 y.o. Caucasian male with known history of lupus anticoagulated, known left lower extremity DVT 2 months ago, now with right lower extremity pain after 2 long car rides greater than 12 hours.  - Considering history, will obtain right Doppler study of lower extremity immediately. Patient does not report missing any doses of his medications.  - Right lower extremity Doppler study was completed at [facility], results are negative for right lower extremity  blood clot. Discussion with radiologist of his findings. Remarked irregular respiratory-cardiac pulsation in bilateral legs. In review of records, and prior CT decided did not recommend repeating CT abdomen. Results were given to patient and family via phone. - Patient advised to continue taking his medications daily as prescribed. Caution with long drives, making certain to stop frequently to walk around.  - Red flags discussed with patient, and he is understanding on when to seek emergent treatment.  - Patient to follow-up with his hematologist.   Results: US Venous Img Lower Unilateral Right  08/24/2014   ADDENDUM REPORT: 08/24/2014 15:38  ADDENDUM: Study discussed by telephone with Dr. Felix Pacini on 08/24/2014 at 1527 hours.  She advises that the patient has been anticoagulated since the initial venous thrombus diagnosis in June, and is complaining currently of right sided lower extremity symptoms which on exam was seen as subtle/mild swelling. No left lower extremity symptoms/swelling. Therefore I advised Dr. Claiborne Billings that I do not think a repeat CT Abdomen and Pelvis is indicated at this time with regard to #2.   Electronically Signed   By: Odessa Fleming M.D.   On: 08/24/2014 15:38   08/24/2014   CLINICAL DATA:  19 year old male with nonocclusive common femoral vein thrombus seen on CT Abdomen and Pelvis 07/10/2014. Right lower extremity pain for 1 week. Lupus. Recent long car trip. Subsequent encounter.  EXAM: RIGHT LOWER EXTREMITY VENOUS DOPPLER ULTRASOUND  TECHNIQUE: Gray-scale sonography with graded compression, as well as color Doppler and duplex ultrasound were performed to evaluate the lower extremity deep venous systems from the level of the common femoral vein and including the common femoral, femoral, profunda femoral, popliteal and calf veins including the posterior tibial, peroneal and gastrocnemius veins when visible. The superficial great saphenous vein was also interrogated. Spectral Doppler was  utilized to evaluate flow at rest and with distal augmentation maneuvers in the common femoral, femoral and popliteal veins.  COMPARISON:  CT Abdomen and Pelvis 07/10/2014  FINDINGS: Contralateral Common Femoral Vein: Mural thrombus (image 3), but with widely patent left common femoral vein otherwise. However, there is loss of normal respiratory phasicity (image 5).  Common Femoral Vein: No evidence of thrombus today. Compression of the vessel appears normal. Decreased respiratory phasicity similar to that on the left, but with preserved response to augmentation. (Image 12).  Saphenofemoral Junction: No evidence of thrombus. Normal compressibility and flow on color Doppler imaging.  Profunda Femoral Vein: No evidence of thrombus. Normal compressibility and flow on color Doppler imaging.  Femoral Vein: No evidence of thrombus. Normal compressibility, respiratory phasicity and response to augmentation.  Popliteal Vein: No evidence of thrombus. Normal compressibility, respiratory phasicity and response to augmentation.  Calf Veins: No evidence of thrombus. Normal compressibility and flow on color Doppler imaging.  Superficial Great Saphenous Vein: No evidence of thrombus. Normal compressibility and flow on color Doppler imaging.  Venous Reflux:  None.  Other Findings:  None.  IMPRESSION: 1. Positive for nonocclusive DVT in the left common femoral  vein, similar to that demonstrated by CT on 07/10/2014. 2. Loss respiratory phasicity in both common femoral veins, such that upstream venous abnormality (such as pelvic vein thrombus or stenosis) cannot be excluded. However, no iliac or IVC thrombus was evident in June. 3. No right lower extremity DVT identified today.  Electronically Signed: By: Odessa Fleming M.D. On: 08/24/2014 15:25

## 2014-08-24 NOTE — Assessment & Plan Note (Signed)
Bryan Robbins is a 19 y.o. Caucasian male with known history of lupus anticoagulated, known left lower extremity DVT 2 months ago, now with right lower extremity pain after 2 long car rides greater than 12 hours.  - Considering history, will obtain right Doppler study of lower extremity immediately. Patient does not report missing any doses of his medications.  - Right lower extremity Doppler study was completed at [facility], results are negative for right lower extremity blood clot. Results were given to patient and family.  - Patient advised to continue taking his medications daily as prescribed. Caution with long drives, making certain to stop frequently to walk around.  - Red flags discussed with patient, and he is understanding on when to seek emergent treatment.  - Patient to follow-up with his hematologist.

## 2014-08-24 NOTE — Telephone Encounter (Signed)
Dr. Claiborne Billings spoke to pt's dad.

## 2014-08-24 NOTE — Telephone Encounter (Signed)
Patient had cramping and discomfort to his legs this week. Went to his PCP and they ordered a doppler. Patient wants to make sure that Dr EnneveMyna Hidalgoe results. Results printed and given to Dr Myna Hidalgo. He has no further orders at this time.

## 2014-09-18 ENCOUNTER — Other Ambulatory Visit: Payer: Self-pay | Admitting: *Deleted

## 2014-09-18 DIAGNOSIS — I82411 Acute embolism and thrombosis of right femoral vein: Secondary | ICD-10-CM

## 2014-09-18 MED ORDER — APIXABAN 5 MG PO TABS: 5.0000 mg | ORAL_TABLET | Freq: Two times a day (BID) | ORAL | Status: DC

## 2014-09-19 ENCOUNTER — Encounter: Payer: Self-pay | Admitting: Family Medicine

## 2014-09-20 ENCOUNTER — Other Ambulatory Visit: Payer: Self-pay | Admitting: *Deleted

## 2014-09-20 MED ORDER — RANITIDINE HCL 150 MG PO CAPS: 150.0000 mg | ORAL_CAPSULE | Freq: Two times a day (BID) | ORAL | Status: DC

## 2014-09-20 NOTE — Telephone Encounter (Signed)
RF request for ranitidine LOV: 08/24/14 Next ov: None Last written: 08/02/14 #60 w/ 3RF

## 2014-10-27 ENCOUNTER — Ambulatory Visit (HOSPITAL_BASED_OUTPATIENT_CLINIC_OR_DEPARTMENT_OTHER): Payer: BLUE CROSS/BLUE SHIELD

## 2014-10-27 ENCOUNTER — Encounter: Payer: Self-pay | Admitting: Hematology & Oncology

## 2014-10-27 ENCOUNTER — Ambulatory Visit (HOSPITAL_BASED_OUTPATIENT_CLINIC_OR_DEPARTMENT_OTHER)
Admission: RE | Admit: 2014-10-27 | Discharge: 2014-10-27 | Disposition: A | Discharge status: Home / Self Care | Payer: BLUE CROSS/BLUE SHIELD | Source: Ambulatory Visit | Attending: Hematology & Oncology | Admitting: Hematology & Oncology

## 2014-10-27 ENCOUNTER — Ambulatory Visit (HOSPITAL_BASED_OUTPATIENT_CLINIC_OR_DEPARTMENT_OTHER): Payer: BLUE CROSS/BLUE SHIELD | Admitting: Hematology & Oncology

## 2014-10-27 VITALS — BP 139/87 | HR 65 | Temp 97.5°F | Resp 16 | Ht 73.0 in | Wt 178.0 lb

## 2014-10-27 DIAGNOSIS — Z86718 Personal history of other venous thrombosis and embolism: Secondary | ICD-10-CM | POA: Diagnosis not present

## 2014-10-27 DIAGNOSIS — I82412 Acute embolism and thrombosis of left femoral vein: Secondary | ICD-10-CM

## 2014-10-27 DIAGNOSIS — I82402 Acute embolism and thrombosis of unspecified deep veins of left lower extremity: Secondary | ICD-10-CM

## 2014-10-27 LAB — CBC WITH DIFFERENTIAL (CANCER CENTER ONLY)
BASO#: 0 10*3/uL (ref 0.0–0.2)
BASO%: 0.1 % (ref 0.0–2.0)
EOS%: 1.2 % (ref 0.0–7.0)
Eosinophils Absolute: 0.1 10*3/uL (ref 0.0–0.5)
HCT: 41.5 % (ref 38.7–49.9)
HEMOGLOBIN: 14.3 g/dL (ref 13.0–17.1)
LYMPH#: 1.7 10*3/uL (ref 0.9–3.3)
LYMPH%: 16.5 % (ref 14.0–48.0)
MCH: 30.2 pg (ref 28.0–33.4)
MCHC: 34.5 g/dL (ref 32.0–35.9)
MCV: 88 fL (ref 82–98)
MONO#: 0.8 10*3/uL (ref 0.1–0.9)
MONO%: 7.8 % (ref 0.0–13.0)
NEUT%: 74.4 % (ref 40.0–80.0)
NEUTROS ABS: 7.6 10*3/uL — AB (ref 1.5–6.5)
Platelets: 218 10*3/uL (ref 145–400)
RBC: 4.74 10*6/uL (ref 4.20–5.70)
RDW: 12.7 % (ref 11.1–15.7)
WBC: 10.2 10*3/uL — AB (ref 4.0–10.0)

## 2014-10-27 LAB — CMP (CANCER CENTER ONLY)
ALK PHOS: 91 U/L — AB (ref 26–84)
ALT: 23 U/L (ref 10–47)
AST: 21 U/L (ref 11–38)
Albumin: 4.2 g/dL (ref 3.3–5.5)
BILIRUBIN TOTAL: 0.7 mg/dL (ref 0.20–1.60)
BUN, Bld: 12 mg/dL (ref 7–22)
CO2: 29 mEq/L (ref 18–33)
CREATININE: 1.1 mg/dL (ref 0.6–1.2)
Calcium: 9.8 mg/dL (ref 8.0–10.3)
Chloride: 100 mEq/L (ref 98–108)
GLUCOSE: 93 mg/dL (ref 73–118)
Potassium: 3.9 mEq/L (ref 3.3–4.7)
SODIUM: 138 meq/L (ref 128–145)
TOTAL PROTEIN: 8.3 g/dL — AB (ref 6.4–8.1)

## 2014-10-31 LAB — THROMBIN CLOTTING TIME: Thrombin Clotting Time: 16 s (ref 13–19)

## 2014-10-31 LAB — RFX PTT-LA W/RFX TO HEX PHASE CONF: PTT-LA Screen: 49 s — ABNORMAL HIGH (ref <=40)

## 2014-10-31 LAB — RFX DRVVT SCR W/RFLX CONF 1:1 MIX
DRVVT SCREEN: 69 s — AB (ref <=45)
dRVVT Mix Interp.: POSITIVE — AB

## 2014-10-31 LAB — LUPUS ANTICOAGULANT PANEL

## 2014-10-31 LAB — D-DIMER, QUANTITATIVE (NOT AT ARMC): D DIMER QUANT: 0.43 ug{FEU}/mL (ref 0.00–0.48)

## 2014-11-02 NOTE — Progress Notes (Signed)
Hematology and Oncology Follow Up Visit  Bryan Robbins [id number] Apr 19, 1995 19 y.o. 11/02/2014   Principle Diagnosis:   Left lower extremity thromboembolic disease  Positive lupus anticoagulant  Current Therapy:    ELIQUIS 5 mg by mouth twice a day  Aspirin 81 mg by mouth daily     Interim History:  Bryan Robbins is back for follow-up. He is on break from school. [place].  He is doing well. His leg is not bothering him. He does have compression stocking that he wears on occasion. He is not aware that she was in the summertime because of the heat.  He and his family went to the [employer]. They brought by preachers of their fishing trip. I am incredibly jealous. They caught quite a few fish.  We did do a Doppler of his leg. 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 This is recanalized. This is a good sign.  He's had no problems with bleeding. He's had a problem with cough or shortness of breath. He's had no only pain. He's had no fever. He's had no change in bowel or bladder habits.  Medications:  Current outpatient prescriptions:  .  apixaban (ELIQUIS) 5 MG TABS tablet, Take 1 tablet (5 mg total) by mouth 2 (two) times daily. 90 Day Supply, Disp: 180 tablet, Rfl: 3 .  aspirin 81 MG tablet, Take 81 mg by mouth daily., Disp: , Rfl:  .  ranitidine (ZANTAC) 150 MG capsule, Take 1 capsule (150 mg total) by mouth 2 (two) times daily., Disp: 180 capsule, Rfl: 1  Allergies:  Allergies  Allergen Reactions  . Penicillins Hives    Past Medical History, Surgical history, Social history, and Family History were reviewed and updated.  Review of Systems: As above  Physical Exam:  height is  (1.854 m) and weight is 178 lb (80.74 kg). His oral temperature is 97.5 F (36.4 C). His blood pressure is 139/87 and his pulse is 65. His respiration is 16.   Wt Readings from Last 3 Encounters:  10/27/14 178 lb (80.74 kg)  08/24/14 180 lb  (81.647 kg)  08/17/14 179 lb (81.194 kg)     Well-developed and well-nourished white male in no obvious distress. Vital signs are stable. Head and neck exam shows no ocular or oral lesions. There are no palpable cervical or supraclavicular lymph nodes. Lungs are clear. Cardiac exam regular rate and rhythm with no murmurs, rubs or bruits. Abdomen is soft. Has good bowel sounds. There is no fluid wave. There is no palpable liver or spleen tip. Back exam shows no tenderness over the spine, ribs or hips. Extremities shows no clubbing, cyanosis or edema. No venous cord is noted in the left leg. Has a negative Homans sign. He has good pulses in his distal extremities. Skin exam shows no rashes, ecchymoses or petechia. Neurological exam is nonfocal.  Lab Results  Component Value Date   WBC 10.2* 10/27/2014   HGB 14.3 10/27/2014   HCT 41.5 10/27/2014   MCV 88 10/27/2014   PLT 218 10/27/2014     Chemistry      Component Value Date/Time   NA 138 10/27/2014 0955   NA 139 06/21/2014 1602   K 3.9 10/27/2014 0955   K 4.7 06/21/2014 1602   CL 100 10/27/2014 0955   CL 102 06/21/2014 1602   CO2 29 10/27/2014 0955   CO2 30 06/21/2014 1602   BUN 12 10/27/2014 0955  BUN 14 06/21/2014 1602   CREATININE 1.1 10/27/2014 0955   CREATININE 1.09 06/21/2014 1602      Component Value Date/Time   CALCIUM 9.8 10/27/2014 0955   CALCIUM 9.9 06/21/2014 1602   ALKPHOS 91* 10/27/2014 0955   ALKPHOS 100 06/21/2014 1602   AST 21 10/27/2014 0955   AST 14 06/21/2014 1602   ALT 23 10/27/2014 0955   ALT 14 06/21/2014 1602   BILITOT 0.70 10/27/2014 0955   BILITOT 0.4 06/21/2014 1602         Impression and Plan: Bryan Robbins is a 19 year old white male. He has a thrombus in the left leg. He does have a positive lupus anticoagulant. I told him that is probable that the lupus anticoagulant will be intermittent. However, I think they probably will need aspirin lifelong.  As far as duration of ELIQUIS, I think  that he probably will need 6 months. He will finish up in December.  I will plan to get him back in December. We will have him come back during his Christmas break. I don't think we need any Dopplers. Clinically, he is improving.  I will recheck his lupus anticoagulant panel.   Josph MachoENNEVER,Chenee Munns R, MD 10/13/20165:18 PM

## 2014-12-27 ENCOUNTER — Other Ambulatory Visit: Payer: BLUE CROSS/BLUE SHIELD

## 2015-01-04 ENCOUNTER — Ambulatory Visit (HOSPITAL_BASED_OUTPATIENT_CLINIC_OR_DEPARTMENT_OTHER): Payer: BLUE CROSS/BLUE SHIELD

## 2015-01-04 ENCOUNTER — Ambulatory Visit (HOSPITAL_BASED_OUTPATIENT_CLINIC_OR_DEPARTMENT_OTHER): Payer: BLUE CROSS/BLUE SHIELD | Admitting: Family

## 2015-01-04 ENCOUNTER — Encounter: Payer: Self-pay | Admitting: Hematology & Oncology

## 2015-01-04 VITALS — BP 139/81 | HR 79 | Temp 97.3°F | Resp 18 | Ht 73.0 in | Wt 177.0 lb

## 2015-01-04 DIAGNOSIS — D6862 Lupus anticoagulant syndrome: Secondary | ICD-10-CM | POA: Diagnosis not present

## 2015-01-04 DIAGNOSIS — I82402 Acute embolism and thrombosis of unspecified deep veins of left lower extremity: Secondary | ICD-10-CM

## 2015-01-04 DIAGNOSIS — Z7982 Long term (current) use of aspirin: Secondary | ICD-10-CM

## 2015-01-04 DIAGNOSIS — I82412 Acute embolism and thrombosis of left femoral vein: Secondary | ICD-10-CM

## 2015-01-04 DIAGNOSIS — R760 Raised antibody titer: Secondary | ICD-10-CM

## 2015-01-04 LAB — CBC WITH DIFFERENTIAL (CANCER CENTER ONLY)
BASO#: 0 10*3/uL (ref 0.0–0.2)
BASO%: 0.2 % (ref 0.0–2.0)
EOS%: 1.8 % (ref 0.0–7.0)
Eosinophils Absolute: 0.2 10*3/uL (ref 0.0–0.5)
HCT: 40.5 % (ref 38.7–49.9)
HGB: 13.9 g/dL (ref 13.0–17.1)
LYMPH#: 1.6 10*3/uL (ref 0.9–3.3)
LYMPH%: 20.1 % (ref 14.0–48.0)
MCH: 30 pg (ref 28.0–33.4)
MCHC: 34.3 g/dL (ref 32.0–35.9)
MCV: 88 fL (ref 82–98)
MONO#: 0.8 10*3/uL (ref 0.1–0.9)
MONO%: 10.2 % (ref 0.0–13.0)
NEUT%: 67.7 % (ref 40.0–80.0)
NEUTROS ABS: 5.5 10*3/uL (ref 1.5–6.5)
PLATELETS: 208 10*3/uL (ref 145–400)
RBC: 4.63 10*6/uL (ref 4.20–5.70)
RDW: 12 % (ref 11.1–15.7)
WBC: 8.1 10*3/uL (ref 4.0–10.0)

## 2015-01-04 LAB — COMPREHENSIVE METABOLIC PANEL
ALK PHOS: 92 U/L (ref 40–150)
ALT: 11 U/L (ref 0–55)
ANION GAP: 9 meq/L (ref 3–11)
AST: 13 U/L (ref 5–34)
Albumin: 4.1 g/dL (ref 3.5–5.0)
BILIRUBIN TOTAL: 0.39 mg/dL (ref 0.20–1.20)
BUN: 18.3 mg/dL (ref 7.0–26.0)
CO2: 25 meq/L (ref 22–29)
Calcium: 9.5 mg/dL (ref 8.4–10.4)
Chloride: 104 mEq/L (ref 98–109)
Creatinine: 1.1 mg/dL (ref 0.7–1.3)
GLUCOSE: 56 mg/dL — AB (ref 70–140)
POTASSIUM: 3.8 meq/L (ref 3.5–5.1)
SODIUM: 139 meq/L (ref 136–145)
TOTAL PROTEIN: 7.7 g/dL (ref 6.4–8.3)

## 2015-01-04 NOTE — Progress Notes (Signed)
Hematology and Oncology Follow Up Visit  Bryan Robbins [id number] March 30, 1995 19 y.o. 01/04/2015   Principle Diagnosis:  Left lower extremity thromboembolic disease Positive lupus anticoagulant  Current Therapy:   ELIQUIS 5 mg by mouth twice a day - completed 01/04/15 Aspirin 81 mg by mouth daily    Interim History:  [name] is here today with his mom for a follow-up. 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 Pedal pulses are +2.   He has some tenderness in the left groin when sitting for an extended period of time.  No swelling in his extremities. He wears a compression stocking as needed to prevent.  He has had no SOB, n/v, cough, rash, dizziness, headache, blurred vision, chest pain, palpitations, abdominal pain or changes in bowel or bladder habits.  No episodes of bleeding or bruising.  He is staying well hydrated with water. His weight is unchanged.  He is enjoying being on break from school and spending time with his family.   Medications:    Medication List       This list is accurate as of: 01/04/15  9:18 AM.  Always use your most recent med list.               apixaban 5 MG Tabs tablet  Commonly known as:  ELIQUIS  Take 1 tablet (5 mg total) by mouth 2 (two) times daily. 90 Day Supply     aspirin 81 MG tablet  Take 81 mg by mouth daily.     ranitidine 150 MG capsule  Commonly known as:  ZANTAC  Take 1 capsule (150 mg total) by mouth 2 (two) times daily.        Allergies:  Allergies  Allergen Reactions  . Penicillins Hives    Past Medical History, Surgical history, Social history, and Family History were reviewed and updated.  Review of Systems: All other 10 point review of systems is negative.   Physical Exam:  height is  (1.854 m) and weight is 177 lb (80.287 kg). His oral temperature is 97.3 F (36.3 C). His blood pressure  is 139/81 and his pulse is 79. His respiration is 18.   Wt Readings from Last 3 Encounters:  01/04/15 177 lb (80.287 kg)  10/27/14 178 lb (80.74 kg)  08/24/14 180 lb (81.647 kg)    Ocular: Sclerae unicteric, pupils equal, round and reactive to light Ear-nose-throat: Oropharynx clear, dentition fair Lymphatic: No cervical or supraclavicular adenopathy Lungs no rales or rhonchi, good excursion bilaterally Heart regular rate and rhythm, no murmur appreciated Abd soft, nontender, positive bowel sounds MSK no focal spinal tenderness, no joint edema Neuro: non-focal, well-oriented, appropriate affect Breasts: Deferred  Lab Results  Component Value Date   WBC 8.1 01/04/2015   HGB 13.9 01/04/2015   HCT 40.5 01/04/2015   MCV 88 01/04/2015   PLT 208 01/04/2015   No results found for: FERRITIN, IRON, TIBC, UIBC, IRONPCTSAT Lab Results  Component Value Date   RBC 4.63 01/04/2015   No results found for: KPAFRELGTCHN, LAMBDASER, KAPLAMBRATIO No results found for: IGGSERUM, IGA, IGMSERUM No results found for: Marda Stalker, SPEI   Chemistry      Component Value Date/Time   NA 138 10/27/2014 0955   NA 139 06/21/2014 1602   K 3.9 10/27/2014 0955   K 4.7 06/21/2014 1602  CL 100 10/27/2014 0955   CL 102 06/21/2014 1602   CO2 29 10/27/2014 0955   CO2 30 06/21/2014 1602   BUN 12 10/27/2014 0955   BUN 14 06/21/2014 1602   CREATININE 1.1 10/27/2014 0955   CREATININE 1.09 06/21/2014 1602      Component Value Date/Time   CALCIUM 9.8 10/27/2014 0955   CALCIUM 9.9 06/21/2014 1602   ALKPHOS 91* 10/27/2014 0955   ALKPHOS 100 06/21/2014 1602   AST 21 10/27/2014 0955   AST 14 06/21/2014 1602   ALT 23 10/27/2014 0955   ALT 14 06/21/2014 1602   BILITOT 0.70 10/27/2014 0955   BILITOT 0.4 06/21/2014 1602     Impression and Plan: [name] is a 19 yo white male with a thrombus of the left leg. He is positive for the lupus  anticoagulant. He is doing well and only has occasional tenderness in the left groin when sitting for a long period of time. He wears his compression stocking as needed. He is asymptomatic at this time.  He completed 6 months of anticoagulation with Eliquis today.  We will have he start taking 2 baby aspirin daily.  We will have him follow-up with us as needed now.  Both he and his parents know to contact us with any questions or concerns.   Verdie MosherINCINNATI,Bronsen Serano M, NP 12/15/20169:18 AM

## 2015-01-06 LAB — RFX DRVVT SCR W/RFLX CONF 1:1 MIX: dRVVT Screen: 58 s — ABNORMAL HIGH (ref <=45)

## 2015-01-06 LAB — RFLX HEXAGONAL PHASE CONFIRM: Hexagonal Phase Confirm: NEGATIVE

## 2015-01-06 LAB — RFX PTT-LA W/RFX TO HEX PHASE CONF: PTT-LA SCREEN: 44 s — AB (ref <=40)

## 2015-01-06 LAB — D-DIMER, QUANTITATIVE (NOT AT ARMC): D DIMER QUANT: 0.27 ug{FEU}/mL (ref 0.00–0.48)

## 2015-01-06 LAB — RFLX DRVVT CONFRIM: Drvvt confirmation: NEGATIVE

## 2015-01-06 LAB — LUPUS ANTICOAGULANT PANEL

## 2015-01-19 ENCOUNTER — Telehealth: Payer: Self-pay | Admitting: *Deleted

## 2015-01-19 ENCOUNTER — Other Ambulatory Visit: Payer: Self-pay | Admitting: *Deleted

## 2015-01-19 DIAGNOSIS — I82412 Acute embolism and thrombosis of left femoral vein: Secondary | ICD-10-CM

## 2015-01-19 NOTE — Telephone Encounter (Signed)
Patient c/o swelling and pain to his L thigh.  Patient states this office instructed him to stop taking his Elliquis and begin taking 2 aspirin daily approximately 2 weeks ago. Since then he states his L leg has had increased swelling and he experiencing tightness in his calf. This is the same leg he had a DVT in.   Spoke to Dr Myna HidalgoEnnever who wants a doppler of the L leg completed to evaluate for DVT.   Scheduled doppler for today and patient stated he was in [place] and returning home tonight. Rescheduled doppler for tomorrow. Patient aware of time.

## 2015-01-20 ENCOUNTER — Ambulatory Visit (HOSPITAL_BASED_OUTPATIENT_CLINIC_OR_DEPARTMENT_OTHER)
Admission: RE | Admit: 2015-01-20 | Discharge: 2015-01-20 | Disposition: A | Discharge status: Home / Self Care | Payer: BLUE CROSS/BLUE SHIELD | Source: Ambulatory Visit | Attending: Hematology & Oncology | Admitting: Hematology & Oncology

## 2015-01-20 DIAGNOSIS — M25562 Pain in left knee: Secondary | ICD-10-CM | POA: Insufficient documentation

## 2015-01-20 DIAGNOSIS — I82492 Acute embolism and thrombosis of other specified deep vein of left lower extremity: Secondary | ICD-10-CM | POA: Insufficient documentation

## 2015-01-20 DIAGNOSIS — I82432 Acute embolism and thrombosis of left popliteal vein: Secondary | ICD-10-CM | POA: Insufficient documentation

## 2015-01-20 DIAGNOSIS — I82412 Acute embolism and thrombosis of left femoral vein: Secondary | ICD-10-CM | POA: Insufficient documentation

## 2015-01-20 DIAGNOSIS — M7989 Other specified soft tissue disorders: Secondary | ICD-10-CM | POA: Insufficient documentation

## 2015-01-23 ENCOUNTER — Other Ambulatory Visit: Payer: Self-pay | Admitting: Hematology & Oncology

## 2015-01-23 DIAGNOSIS — I82412 Acute embolism and thrombosis of left femoral vein: Secondary | ICD-10-CM

## 2015-01-26 ENCOUNTER — Ambulatory Visit (HOSPITAL_BASED_OUTPATIENT_CLINIC_OR_DEPARTMENT_OTHER): Payer: BLUE CROSS/BLUE SHIELD

## 2015-01-26 ENCOUNTER — Ambulatory Visit (HOSPITAL_BASED_OUTPATIENT_CLINIC_OR_DEPARTMENT_OTHER): Payer: BLUE CROSS/BLUE SHIELD | Admitting: Family

## 2015-01-26 ENCOUNTER — Encounter: Payer: Self-pay | Admitting: Family

## 2015-01-26 VITALS — BP 118/71 | HR 84 | Temp 97.6°F | Resp 18 | Ht 73.0 in | Wt 175.0 lb

## 2015-01-26 DIAGNOSIS — I82402 Acute embolism and thrombosis of unspecified deep veins of left lower extremity: Secondary | ICD-10-CM

## 2015-01-26 DIAGNOSIS — I82412 Acute embolism and thrombosis of left femoral vein: Secondary | ICD-10-CM

## 2015-01-26 DIAGNOSIS — D6862 Lupus anticoagulant syndrome: Secondary | ICD-10-CM

## 2015-01-26 LAB — COMPREHENSIVE METABOLIC PANEL (CC13)
ALBUMIN: 4.6 g/dL (ref 3.6–5.1)
ALT: 12 U/L (ref 9–46)
AST: 13 U/L (ref 10–40)
Alkaline Phosphatase: 93 U/L (ref 40–115)
BUN: 16 mg/dL (ref 7–25)
CALCIUM: 9.7 mg/dL (ref 8.6–10.3)
CHLORIDE: 99 mmol/L (ref 98–110)
CO2: 28 mmol/L (ref 20–31)
CREATININE: 1 mg/dL (ref 0.60–1.35)
Glucose, Bld: 88 mg/dL (ref 65–99)
POTASSIUM: 4 mmol/L (ref 3.5–5.3)
SODIUM: 135 mmol/L (ref 135–146)
TOTAL PROTEIN: 7.8 g/dL (ref 6.1–8.1)
Total Bilirubin: 0.5 mg/dL (ref 0.2–1.2)

## 2015-01-26 LAB — CBC WITH DIFFERENTIAL (CANCER CENTER ONLY)
BASO#: 0 10*3/uL (ref 0.0–0.2)
BASO%: 0.2 % (ref 0.0–2.0)
EOS%: 1.5 % (ref 0.0–7.0)
Eosinophils Absolute: 0.1 10*3/uL (ref 0.0–0.5)
HEMATOCRIT: 41.4 % (ref 38.7–49.9)
HGB: 14.3 g/dL (ref 13.0–17.1)
LYMPH#: 1.8 10*3/uL (ref 0.9–3.3)
LYMPH%: 22.3 % (ref 14.0–48.0)
MCH: 29.7 pg (ref 28.0–33.4)
MCHC: 34.5 g/dL (ref 32.0–35.9)
MCV: 86 fL (ref 82–98)
MONO#: 0.9 10*3/uL (ref 0.1–0.9)
MONO%: 10.3 % (ref 0.0–13.0)
NEUT#: 5.4 10*3/uL (ref 1.5–6.5)
NEUT%: 65.7 % (ref 40.0–80.0)
Platelets: 245 10*3/uL (ref 145–400)
RBC: 4.82 10*6/uL (ref 4.20–5.70)
RDW: 12.2 % (ref 11.1–15.7)
WBC: 8.2 10*3/uL (ref 4.0–10.0)

## 2015-01-26 LAB — D-DIMER, QUANTITATIVE: D-Dimer, Quant: 0.35 ug/mL-FEU (ref 0.00–0.48)

## 2015-01-26 MED ORDER — APIXABAN 5 MG PO TABS: 5.0000 mg | ORAL_TABLET | Freq: Two times a day (BID) | ORAL | Status: DC

## 2015-01-26 NOTE — Progress Notes (Signed)
Hematology and Oncology Follow Up Visit  Bryan BendersRiley Robbins [id number] Jun 01, 1995 19 y.o. 01/26/2015   Principle Diagnosis:  Recurrent left lower extremity thromboembolic disease Positive lupus anticoagulant  Current Therapy:   ELIQUIS 5 mg by mouth twice a day - lifelong Aspirin 81 mg by mouth daily    Interim History:  Bryan Robbins is here today with his parents with for a follow-up after developing pain and swelling in the left leg. US confirmed recurrent DVT of the left distal femoral, popliteal and calf veins below the knee. He had completed 6 months of Eliquis in early December 2016 and was then transitioned to 2 baby aspirin daily. 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 His DVT recurred in less than of stopping his anticoagulation which is quite impressive.  He is now back on Eliquis 5 mg BID and 1 baby aspirin daily. The swelling in his left leg has improved. There is still some tenderness in the calf.   He has had no SOB, n/v, cough, rash, dizziness, headache, blurred vision, chest pain, palpitations, abdominal pain or changes in bowel or bladder habits.  No episodes of bleeding or bruising while on the anticoagulation.   He loves water and is staying well hydrated. His weight is unchanged.   Medications:    Medication List       This list is accurate as of: 01/26/15  3:12 PM.  Always use your most recent med list.               aspirin 81 MG tablet  Take 81 mg by mouth daily.     ELIQUIS 5 MG Tabs tablet  Generic drug:  apixaban        Allergies:  Allergies  Allergen Reactions  . Penicillins Hives    Past Medical History, Surgical history, Social history, and Family History were reviewed and updated.  Review of Systems: All other 10 point review of systems is negative.   Physical Exam:  height is 6\' 1"  (1.854 m) and  weight is 175 lb (79.379 kg). His oral temperature is 97.6 F (36.4 C). His blood pressure is 118/71 and his pulse is 84. His respiration is 18.   Wt Readings from Last 3 Encounters:  01/26/15 175 lb (79.379 kg)  01/04/15 177 lb (80.287 kg)  10/27/14 178 lb (80.74 kg)    Ocular: Sclerae unicteric, pupils equal, round and reactive to light Ear-nose-throat: Oropharynx clear, dentition fair Lymphatic: No cervical or supraclavicular adenopathy Lungs no rales or rhonchi, good excursion bilaterally Heart regular rate and rhythm, no murmur appreciated Abd soft, nontender, positive bowel sounds, no liver or spleen tip palpated on exam MSK no focal spinal tenderness, no joint edema Neuro: non-focal, well-oriented, appropriate affect Breasts: Deferred  Lab Results  Component Value Date   WBC 8.2 01/26/2015   HGB 14.3 01/26/2015   HCT 41.4 01/26/2015   MCV 86 01/26/2015   PLT 245 01/26/2015   No results found for: FERRITIN, IRON, TIBC, UIBC, IRONPCTSAT Lab Results  Component Value Date   RBC 4.82 01/26/2015   No results found for: KPAFRELGTCHN, LAMBDASER, KAPLAMBRATIO No results found for: IGGSERUM, IGA, IGMSERUM No results found for: Marda StalkerOTALPROTELP, ALBUMINELP, A1GS, A2GS, BETS, BETA2SER, GAMS, MSPIKE, SPEI   Chemistry      Component Value Date/Time   NA 139 01/04/2015 0828   NA 138 10/27/2014 0955  NA 139 06/21/2014 1602   K 3.8 01/04/2015 0828   K 3.9 10/27/2014 0955   K 4.7 06/21/2014 1602   CL 100 10/27/2014 0955   CL 102 06/21/2014 1602   CO2 25 01/04/2015 0828   CO2 29 10/27/2014 0955   CO2 30 06/21/2014 1602   BUN 18.3 01/04/2015 0828   BUN 12 10/27/2014 0955   BUN 14 06/21/2014 1602   CREATININE 1.1 01/04/2015 0828   CREATININE 1.1 10/27/2014 0955   CREATININE 1.09 06/21/2014 1602      Component Value Date/Time   CALCIUM 9.5 01/04/2015 0828   CALCIUM 9.8 10/27/2014 0955   CALCIUM 9.9 06/21/2014 1602   ALKPHOS 92 01/04/2015 0828   ALKPHOS 91* 10/27/2014 0955     ALKPHOS 100 06/21/2014 1602   AST 13 01/04/2015 0828   AST 21 10/27/2014 0955   AST 14 06/21/2014 1602   ALT 11 01/04/2015 0828   ALT 23 10/27/2014 0955   ALT 14 06/21/2014 1602   BILITOT 0.39 01/04/2015 0828   BILITOT 0.70 10/27/2014 0955   BILITOT 0.4 06/21/2014 1602     Impression and Plan: Bryan Robbins is a 20 yo white male with a recurrent thrombus of the left leg. He is positive for the lupus anticoagulant. His DVT recurred less than 1 month after completing anticoagulation with Eliquis.  He is now back on Eliquis 5 mg BID and 1 baby aspirin daily. He has some tenderness in the left calf but the swelling has improved since restarting his anticoagulant.  We will plan to see him back in March when he is home for spring break. He will have a repeat doppler study of his leg that same day.  Both he and his parents know to contact us with any questions or concerns.   Verdie Mosher, NP 1/6/20173:12 PM

## 2015-01-31 LAB — RFX DRVVT SCR W/RFLX CONF 1:1 MIX: DRVVT SCREEN: 63 s — AB (ref <=45)

## 2015-01-31 LAB — THROMBIN CLOTTING TIME: THROMBIN CLOTTING TIME: 16 s (ref 13–19)

## 2015-01-31 LAB — LUPUS ANTICOAGULANT PANEL

## 2015-01-31 LAB — RFX PTT-LA W/RFX TO HEX PHASE CONF: PTT-LA SCREEN: 55 s — AB (ref <=40)

## 2015-01-31 LAB — RFLX HEXAGONAL PHASE CONFIRM: Hexagonal Phase Confirm: POSITIVE — AB

## 2015-01-31 LAB — RFLX DRVVT CONFRIM: Drvvt confirmation: NEGATIVE

## 2015-02-10 ENCOUNTER — Other Ambulatory Visit: Payer: Self-pay | Admitting: Family Medicine

## 2015-02-12 NOTE — Telephone Encounter (Signed)
RF request for ranitidine LOV: 06/21/14 Next ov: None Last written: unknown

## 2015-03-26 ENCOUNTER — Other Ambulatory Visit: Payer: BLUE CROSS/BLUE SHIELD

## 2015-03-26 ENCOUNTER — Ambulatory Visit (HOSPITAL_BASED_OUTPATIENT_CLINIC_OR_DEPARTMENT_OTHER): Payer: BLUE CROSS/BLUE SHIELD

## 2015-03-26 ENCOUNTER — Ambulatory Visit: Payer: BLUE CROSS/BLUE SHIELD | Admitting: Family

## 2015-03-27 ENCOUNTER — Ambulatory Visit (HOSPITAL_BASED_OUTPATIENT_CLINIC_OR_DEPARTMENT_OTHER)
Admission: RE | Admit: 2015-03-27 | Discharge: 2015-03-27 | Disposition: A | Discharge status: Home / Self Care | Payer: BLUE CROSS/BLUE SHIELD | Source: Ambulatory Visit | Attending: Family | Admitting: Family

## 2015-03-27 ENCOUNTER — Ambulatory Visit (HOSPITAL_BASED_OUTPATIENT_CLINIC_OR_DEPARTMENT_OTHER): Payer: BLUE CROSS/BLUE SHIELD | Admitting: Family

## 2015-03-27 ENCOUNTER — Encounter: Payer: Self-pay | Admitting: Family

## 2015-03-27 ENCOUNTER — Other Ambulatory Visit (HOSPITAL_BASED_OUTPATIENT_CLINIC_OR_DEPARTMENT_OTHER): Payer: BLUE CROSS/BLUE SHIELD

## 2015-03-27 VITALS — BP 122/78 | HR 85 | Temp 98.6°F | Resp 18 | Ht 73.0 in | Wt 177.0 lb

## 2015-03-27 DIAGNOSIS — D6862 Lupus anticoagulant syndrome: Secondary | ICD-10-CM

## 2015-03-27 DIAGNOSIS — I82439 Acute embolism and thrombosis of unspecified popliteal vein: Secondary | ICD-10-CM

## 2015-03-27 DIAGNOSIS — I82532 Chronic embolism and thrombosis of left popliteal vein: Secondary | ICD-10-CM | POA: Diagnosis not present

## 2015-03-27 DIAGNOSIS — I82402 Acute embolism and thrombosis of unspecified deep veins of left lower extremity: Secondary | ICD-10-CM

## 2015-03-27 DIAGNOSIS — I82512 Chronic embolism and thrombosis of left femoral vein: Secondary | ICD-10-CM | POA: Diagnosis not present

## 2015-03-27 DIAGNOSIS — Z7901 Long term (current) use of anticoagulants: Secondary | ICD-10-CM

## 2015-03-27 DIAGNOSIS — I82419 Acute embolism and thrombosis of unspecified femoral vein: Secondary | ICD-10-CM

## 2015-03-27 DIAGNOSIS — I82412 Acute embolism and thrombosis of left femoral vein: Secondary | ICD-10-CM

## 2015-03-27 DIAGNOSIS — D68312 Antiphospholipid antibody with hemorrhagic disorder: Secondary | ICD-10-CM

## 2015-03-27 LAB — COMPREHENSIVE METABOLIC PANEL
ALT: 16 U/L (ref 0–55)
AST: 13 U/L (ref 5–34)
Albumin: 4.5 g/dL (ref 3.5–5.0)
Alkaline Phosphatase: 102 U/L (ref 40–150)
Anion Gap: 12 mEq/L — ABNORMAL HIGH (ref 3–11)
BILIRUBIN TOTAL: 0.61 mg/dL (ref 0.20–1.20)
BUN: 13.2 mg/dL (ref 7.0–26.0)
CO2: 23 meq/L (ref 22–29)
Calcium: 9.7 mg/dL (ref 8.4–10.4)
Chloride: 104 mEq/L (ref 98–109)
Creatinine: 1.1 mg/dL (ref 0.7–1.3)
GLUCOSE: 86 mg/dL (ref 70–140)
Potassium: 3.7 mEq/L (ref 3.5–5.1)
SODIUM: 139 meq/L (ref 136–145)
TOTAL PROTEIN: 8.3 g/dL (ref 6.4–8.3)

## 2015-03-27 LAB — CBC WITH DIFFERENTIAL (CANCER CENTER ONLY)
BASO#: 0 10*3/uL (ref 0.0–0.2)
BASO%: 0.2 % (ref 0.0–2.0)
EOS%: 4.1 % (ref 0.0–7.0)
Eosinophils Absolute: 0.2 10*3/uL (ref 0.0–0.5)
HCT: 40.3 % (ref 38.7–49.9)
HGB: 14.1 g/dL (ref 13.0–17.1)
LYMPH#: 1.7 10*3/uL (ref 0.9–3.3)
LYMPH%: 30 % (ref 14.0–48.0)
MCH: 30.5 pg (ref 28.0–33.4)
MCHC: 35 g/dL (ref 32.0–35.9)
MCV: 87 fL (ref 82–98)
MONO#: 0.5 10*3/uL (ref 0.1–0.9)
MONO%: 9.6 % (ref 0.0–13.0)
NEUT#: 3.1 10*3/uL (ref 1.5–6.5)
NEUT%: 56.1 % (ref 40.0–80.0)
PLATELETS: 193 10*3/uL (ref 145–400)
RBC: 4.62 10*6/uL (ref 4.20–5.70)
RDW: 12.4 % (ref 11.1–15.7)
WBC: 5.6 10*3/uL (ref 4.0–10.0)

## 2015-03-27 NOTE — Progress Notes (Signed)
Hematology and Oncology Follow Up Visit  Bryan Robbins [id number] Feb 17, 1995 20 y.o. 03/27/2015   Principle Diagnosis:  Recurrent left lower extremity thromboembolic disease Positive lupus anticoagulant  Current Therapy:   ELIQUIS 5 mg by mouth twice a day - lifelong Aspirin 81 mg by mouth daily    Interim History:  Bryan Robbins is here today with his dad for a follow-up. He is doing well and has no complaints at this time.  His US showed a chronic nearly occlusive DVT within the distal aspect of the femoral vein extending through the popliteal vein. He states that he will occassionally have some swelling and discomfort if he is on his feet for a long period of time. He wears a compression stocking daily.    No SOB, fever, chills, n/v, cough, rash, dizziness, headache, blurred vision, chest pain, palpitations, abdominal pain or changes in bowel or bladder habits.  No episodes of bleeding or bruising while on the anticoagulant.   He loves water and is staying well hydrated. His weight is unchanged.   Medications:    Medication List       This list is accurate as of: 03/27/15 10:01 AM.  Always use your most recent med list.               apixaban 5 MG Tabs tablet  Commonly known as:  ELIQUIS  Take 1 tablet (5 mg total) by mouth 2 (two) times daily.     aspirin 81 MG tablet  Take 81 mg by mouth daily.        Allergies:  Allergies  Allergen Reactions  . Penicillins Hives    Past Medical History, Surgical history, Social history, and Family History were reviewed and updated.  Review of Systems: All other 10 point review of systems is negative.   Physical Exam:  height is  (1.854 m) and weight is 177 lb (80.287 kg). His oral temperature is 98.6 F (37 C). His blood pressure is 122/78 and his pulse is 85. His respiration is 18.   Wt Readings from Last 3 Encounters:  03/27/15 177 lb (80.287 kg)  01/26/15 175 lb (79.379 kg)  01/04/15 177 lb (80.287 kg)    Ocular:  Sclerae unicteric, pupils equal, round and reactive to light Ear-nose-throat: Oropharynx clear, dentition fair Lymphatic: No cervical supraclavicular or axillary adenopathy Lungs no rales or rhonchi, good excursion bilaterally Heart regular rate and rhythm, no murmur appreciated Abd soft, nontender, positive bowel sounds, no liver or spleen tip palpated on exam MSK no focal spinal tenderness, no joint edema Neuro: non-focal, well-oriented, appropriate affect Breasts: Deferred  Lab Results  Component Value Date   WBC 5.6 03/27/2015   HGB 14.1 03/27/2015   HCT 40.3 03/27/2015   MCV 87 03/27/2015   PLT 193 03/27/2015   No results found for: FERRITIN, IRON, TIBC, UIBC, IRONPCTSAT Lab Results  Component Value Date   RBC 4.62 03/27/2015   No results found for: KPAFRELGTCHN, LAMBDASER, KAPLAMBRATIO No results found for: IGGSERUM, IGA, IGMSERUM No results found for: Marda Stalker, SPEI   Chemistry      Component Value Date/Time   NA 135 01/26/2015 1424   NA 139 01/04/2015 0828   NA 138 10/27/2014 0955   K 4.0 01/26/2015 1424   K 3.8 01/04/2015 0828   K 3.9 10/27/2014 0955   CL 99 01/26/2015 1424   CL 100 10/27/2014 0955   CO2 28 01/26/2015 1424   CO2 25 01/04/2015  0828   CO2 29 10/27/2014 0955   BUN 16 01/26/2015 1424   BUN 18.3 01/04/2015 0828   BUN 12 10/27/2014 0955   CREATININE 1.00 01/26/2015 1424   CREATININE 1.1 01/04/2015 0828   CREATININE 1.1 10/27/2014 0955      Component Value Date/Time   CALCIUM 9.7 01/26/2015 1424   CALCIUM 9.5 01/04/2015 0828   CALCIUM 9.8 10/27/2014 0955   ALKPHOS 93 01/26/2015 1424   ALKPHOS 92 01/04/2015 0828   ALKPHOS 91* 10/27/2014 0955   AST 13 01/26/2015 1424   AST 13 01/04/2015 0828   AST 21 10/27/2014 0955   ALT 12 01/26/2015 1424   ALT 11 01/04/2015 0828   ALT 23 10/27/2014 0955   BILITOT 0.5 01/26/2015 1424   BILITOT 0.39 01/04/2015 0828   BILITOT 0.70 10/27/2014 0955       Impression and Plan: Bryan Robbins is a 20 yo white male with a recurrent thrombus of the left leg. He is positive for the lupus anticoagulant. His DVT recurred less than 1 month after completing anticoagulation with Eliquis. He is now on life long anticoagulation with Eliquis 5 mg BID and 1 baby aspirin daily. He has had no episodes of bleeding.  He still has a near occlusive chronic DVT in the distal aspect of the femoral vein that extends through the popliteal vein.  We will plan to see him back in 5 months before he returns to college. We will get a repeat US that same day.  A script for 2 new compression stockings was given to him.  Both he and his parents know to contact us with any questions or concerns. We can certainly see hi sooner if need be.   Verdie MosherINCINNATI,SARAH M, NP 3/7/201710:01 AM

## 2015-03-28 LAB — D-DIMER, QUANTITATIVE (NOT AT ARMC): D-DIMER: 0.2 mg{FEU}/L (ref 0.00–0.49)

## 2015-03-29 LAB — LUPUS ANTICOAGULANT PANEL
DRVVT: 70.2 s — AB (ref 0.0–44.0)
HEXAGONAL PHASE PHOSPHOLIPID: 10 s (ref 0–11)
PTT-LA Mix: 44.1 s — ABNORMAL HIGH (ref 0.0–40.6)
PTT-LA: 49.9 s — AB (ref 0.0–43.6)
dRVVT Confirm: 1.3 ratio — ABNORMAL HIGH (ref 0.8–1.2)
dRVVT Mix: 50.8 s — ABNORMAL HIGH (ref 0.0–44.0)

## 2015-04-11 ENCOUNTER — Other Ambulatory Visit: Payer: Self-pay | Admitting: *Deleted

## 2015-04-18 ENCOUNTER — Telehealth: Payer: Self-pay | Admitting: Family Medicine

## 2015-04-18 DIAGNOSIS — I82402 Acute embolism and thrombosis of unspecified deep veins of left lower extremity: Secondary | ICD-10-CM

## 2015-04-18 DIAGNOSIS — R760 Raised antibody titer: Secondary | ICD-10-CM

## 2015-04-18 NOTE — Telephone Encounter (Signed)
OK, referral ordered as per parent's request.

## 2015-04-18 NOTE — Telephone Encounter (Signed)
Patient's father is requesting a 2nd opinion. [facility] Dr[facility] Jolyne LoaFarland.

## 2015-04-18 NOTE — Telephone Encounter (Signed)
Please advise. Thanks.  

## 2015-04-19 NOTE — Telephone Encounter (Signed)
Pts father advised and voiced understanding. I did advise him that pt will need to come by the office and sign a DPR. He stated that he will have pt come by when he comes back home from college.

## 2015-07-26 ENCOUNTER — Other Ambulatory Visit: Payer: Self-pay | Admitting: *Deleted

## 2015-07-26 DIAGNOSIS — I82402 Acute embolism and thrombosis of unspecified deep veins of left lower extremity: Secondary | ICD-10-CM

## 2015-07-26 MED ORDER — APIXABAN 5 MG PO TABS: 5.0000 mg | ORAL_TABLET | Freq: Two times a day (BID) | ORAL | Status: DC

## 2015-08-28 ENCOUNTER — Other Ambulatory Visit (HOSPITAL_BASED_OUTPATIENT_CLINIC_OR_DEPARTMENT_OTHER): Payer: BLUE CROSS/BLUE SHIELD

## 2015-08-28 ENCOUNTER — Ambulatory Visit (HOSPITAL_BASED_OUTPATIENT_CLINIC_OR_DEPARTMENT_OTHER)
Admission: RE | Admit: 2015-08-28 | Discharge: 2015-08-28 | Disposition: A | Discharge status: Home / Self Care | Payer: BLUE CROSS/BLUE SHIELD | Source: Ambulatory Visit | Attending: Family | Admitting: Family

## 2015-08-28 ENCOUNTER — Ambulatory Visit (HOSPITAL_BASED_OUTPATIENT_CLINIC_OR_DEPARTMENT_OTHER): Payer: BLUE CROSS/BLUE SHIELD | Admitting: Hematology & Oncology

## 2015-08-28 VITALS — BP 126/75 | HR 66 | Resp 20 | Wt 175.0 lb

## 2015-08-28 DIAGNOSIS — I82402 Acute embolism and thrombosis of unspecified deep veins of left lower extremity: Secondary | ICD-10-CM

## 2015-08-28 DIAGNOSIS — I82412 Acute embolism and thrombosis of left femoral vein: Secondary | ICD-10-CM

## 2015-08-28 DIAGNOSIS — D6862 Lupus anticoagulant syndrome: Secondary | ICD-10-CM

## 2015-08-28 DIAGNOSIS — D68312 Antiphospholipid antibody with hemorrhagic disorder: Secondary | ICD-10-CM

## 2015-08-28 DIAGNOSIS — I82532 Chronic embolism and thrombosis of left popliteal vein: Secondary | ICD-10-CM | POA: Insufficient documentation

## 2015-08-28 DIAGNOSIS — I82512 Chronic embolism and thrombosis of left femoral vein: Secondary | ICD-10-CM | POA: Insufficient documentation

## 2015-08-28 LAB — CBC WITH DIFFERENTIAL (CANCER CENTER ONLY)
BASO#: 0 10*3/uL (ref 0.0–0.2)
BASO%: 0.3 % (ref 0.0–2.0)
EOS%: 2 % (ref 0.0–7.0)
Eosinophils Absolute: 0.2 10*3/uL (ref 0.0–0.5)
HEMATOCRIT: 42.5 % (ref 38.7–49.9)
HGB: 14.5 g/dL (ref 13.0–17.1)
LYMPH#: 2.1 10*3/uL (ref 0.9–3.3)
LYMPH%: 27.2 % (ref 14.0–48.0)
MCH: 30.9 pg (ref 28.0–33.4)
MCHC: 34.1 g/dL (ref 32.0–35.9)
MCV: 91 fL (ref 82–98)
MONO#: 0.7 10*3/uL (ref 0.1–0.9)
MONO%: 9.1 % (ref 0.0–13.0)
NEUT#: 4.7 10*3/uL (ref 1.5–6.5)
NEUT%: 61.4 % (ref 40.0–80.0)
PLATELETS: 230 10*3/uL (ref 145–400)
RBC: 4.69 10*6/uL (ref 4.20–5.70)
RDW: 12.7 % (ref 11.1–15.7)
WBC: 7.6 10*3/uL (ref 4.0–10.0)

## 2015-08-28 LAB — COMPREHENSIVE METABOLIC PANEL
ALT: 19 U/L (ref 0–55)
AST: 15 U/L (ref 5–34)
Albumin: 4.2 g/dL (ref 3.5–5.0)
Alkaline Phosphatase: 96 U/L (ref 40–150)
Anion Gap: 9 mEq/L (ref 3–11)
BUN: 13.4 mg/dL (ref 7.0–26.0)
CALCIUM: 9.8 mg/dL (ref 8.4–10.4)
CHLORIDE: 103 meq/L (ref 98–109)
CO2: 28 meq/L (ref 22–29)
Creatinine: 1.1 mg/dL (ref 0.7–1.3)
EGFR: >90 mL/min/{1.73_m2} (ref >90)
Glucose: 91 mg/dl (ref 70–140)
POTASSIUM: 4.1 meq/L (ref 3.5–5.1)
SODIUM: 139 meq/L (ref 136–145)
Total Bilirubin: 0.43 mg/dL (ref 0.20–1.20)
Total Protein: 7.8 g/dL (ref 6.4–8.3)

## 2015-08-28 NOTE — Progress Notes (Signed)
Hematology and Oncology Follow Up Visit  Saddie BendersRiley Umbach [id number] Jun 09, 1995 20 y.o. 08/28/2015   Principle Diagnosis:   Left lower extremity thromboembolic disease  Positive lupus anticoagulant  Current Therapy:    ELIQUIS 5 mg by mouth twice a day  Aspirin 81 mg by mouth daily     Interim History:  [name] is back for follow-up. He's busy taking summer school classes. He is taking calculus and physics. I think he has completed this. He will start back his senior year next week.  I did go and get Doppler of his left leg. This shows a chronic nonocclusive thrombus. This however looks better.  His left leg is not bothering him. He is still taking the baby aspirin.  He's had no bleeding or bruising. His been no change in bowel or bladder habits. He's had no nausea or vomiting. He's had no infections. He's had no cough or shortness of breath.  Overall, his performance status is ECOG 0.  Medications:  Current Outpatient Prescriptions:  .  apixaban (ELIQUIS) 5 MG TABS tablet, Take 1 tablet (5 mg total) by mouth 2 (two) times daily., Disp: 14 tablet, Rfl: 0 .  aspirin 81 MG tablet, Take 81 mg by mouth daily., Disp: , Rfl:   Allergies:  Allergies  Allergen Reactions  . Penicillins Hives    Past Medical History, Surgical history, Social history, and Family History were reviewed and updated.  Review of Systems: As above  Physical Exam:  weight is 175 lb (79.4 kg). His blood pressure is 126/75 and his pulse is 66. His respiration is 20.   Wt Readings from Last 3 Encounters:  08/28/15 175 lb (79.4 kg)  03/27/15 177 lb (80.3 kg)  01/26/15 175 lb (79.4 kg)     Well-developed and well-nourished white male in no obvious distress. Vital signs are stable. Head and neck exam shows no ocular or oral lesions. There are no palpable cervical or supraclavicular lymph nodes. Lungs are clear. Cardiac exam regular rate and rhythm with no murmurs, rubs or bruits. Abdomen is soft. Has  good bowel sounds. There is no fluid wave. There is no palpable liver or spleen tip. Back exam shows no tenderness over the spine, ribs or hips. Extremities shows no clubbing, cyanosis or edema. No venous cord is noted in the left leg. Has a negative Homans sign. He has good pulses in his distal extremities. Skin exam shows no rashes, ecchymoses or petechia. Neurological exam is nonfocal.  Lab Results  Component Value Date   WBC 7.6 08/28/2015   HGB 14.5 08/28/2015   HCT 42.5 08/28/2015   MCV 91 08/28/2015   PLT 230 08/28/2015     Chemistry      Component Value Date/Time   NA 139 03/27/2015 0930   K 3.7 03/27/2015 0930   CL 99 01/26/2015 1424   CL 100 10/27/2014 0955   CO2 23 03/27/2015 0930   BUN 13.2 03/27/2015 0930   CREATININE 1.1 03/27/2015 0930      Component Value Date/Time   CALCIUM 9.7 03/27/2015 0930   ALKPHOS 102 03/27/2015 0930   AST 13 03/27/2015 0930   ALT 16 03/27/2015 0930   BILITOT 0.61 03/27/2015 0930         Impression and Plan: [name] is a 20 year old white male. He has a thrombus in the left leg. He does have a positive lupus anticoagulant. I told him that is probable that the lupus anticoagulant will be intermittent. However, I think they probably  will need aspirin lifelong.  I will go ahead and cut his ELIQUIS dose back to 2.5 mg by mouth twice a day. I think this would be good for maintenance for him.  He will stay on the aspirin.  I will like to see him back in about 4 months. This will be his Christmas break. I do want to do a Doppler of his left leg.. I think this is necessary since we are changing his dose.    Josph Macho, MD 8/8/201710:43 AM

## 2015-08-29 LAB — D-DIMER, QUANTITATIVE (NOT AT ARMC)

## 2015-08-30 LAB — LUPUS ANTICOAGULANT PANEL
PTT-LA: 44 s (ref 0.0–51.9)
dRVVT Mix: 46.5 s (ref 0.0–47.0)
dRVVT: 60.2 s — ABNORMAL HIGH (ref 0.0–47.0)

## 2015-09-06 ENCOUNTER — Encounter: Payer: Self-pay | Admitting: Family Medicine

## 2015-10-28 IMAGING — US US EXTREM LOW VENOUS*L*
1 series · 12 of 24 positions shown · non-contrast
Comparison: None.

CLINICAL DATA: 19-year-old male with a history of left common
femoral vein thrombosis on 08/24/2014. Patient has subsequently been
on anticoagulation. Evaluate for residual or recurrent DVT.



[Series 1: us extrem low venous*left* · 0.05mm/px · 12 of 46 slices shown]
[im 2/46]
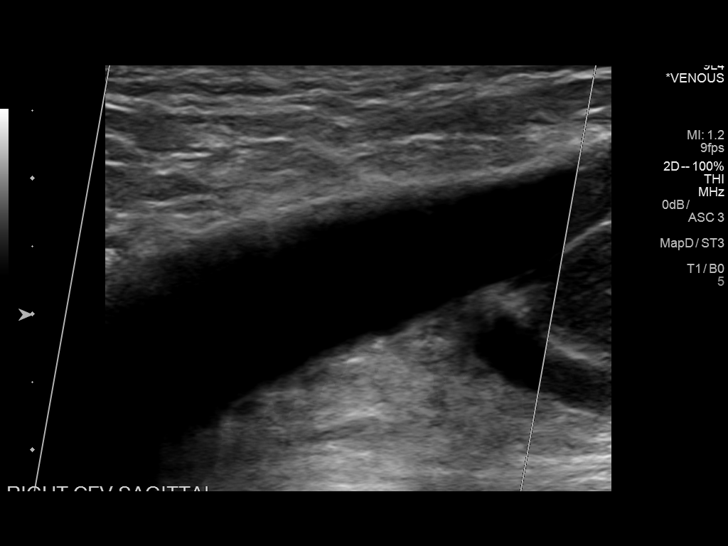
[im 6/46]
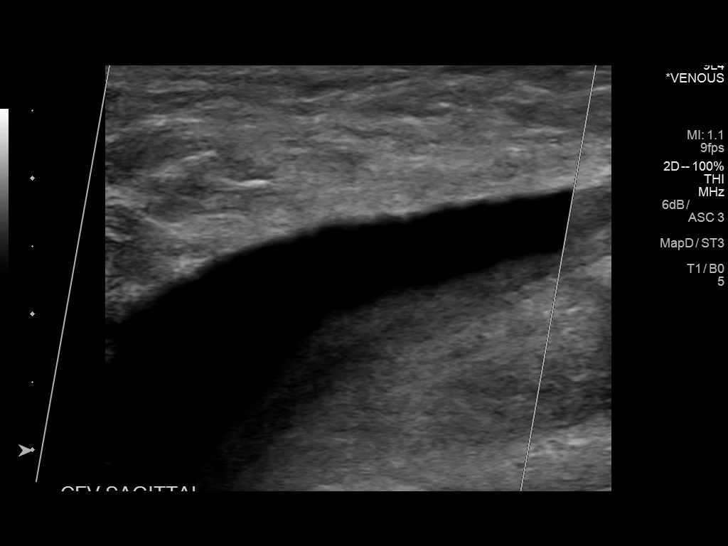
[im 10/46]
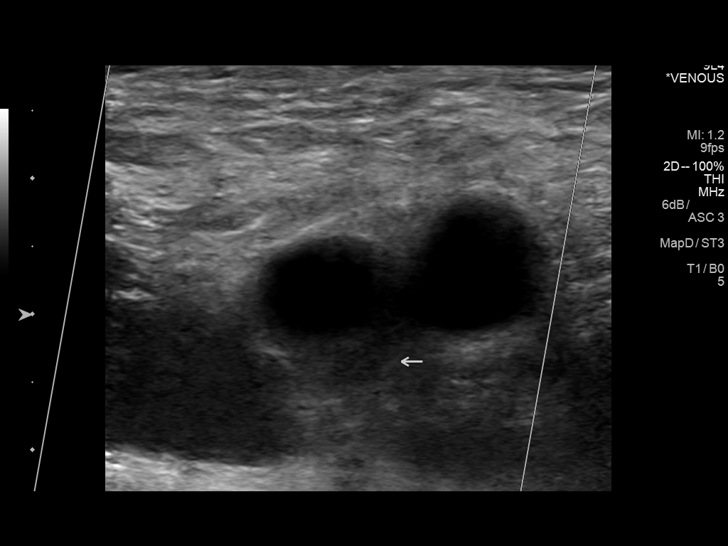
[im 14/46]
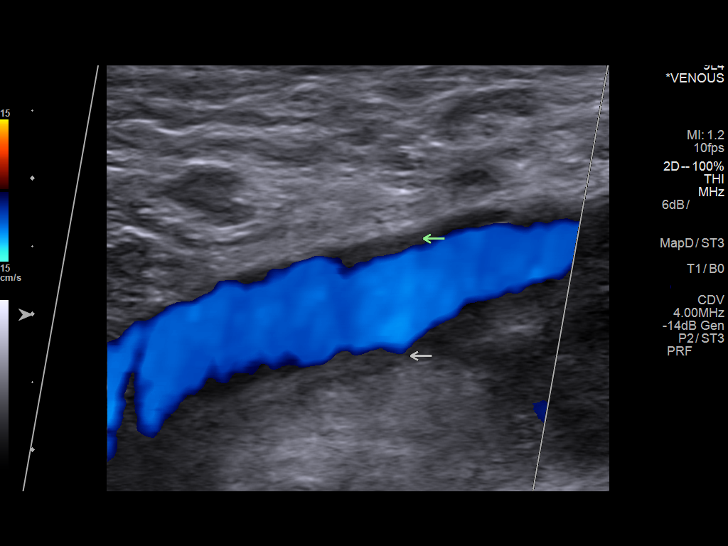
[im 18/46]
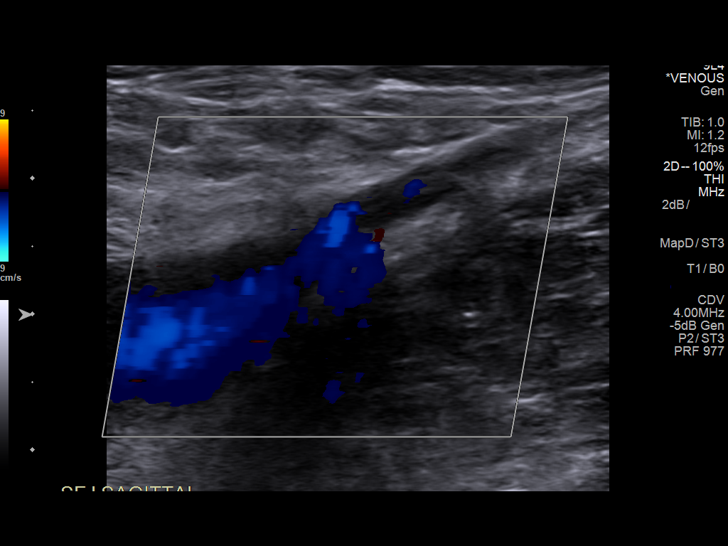
[im 22/46]
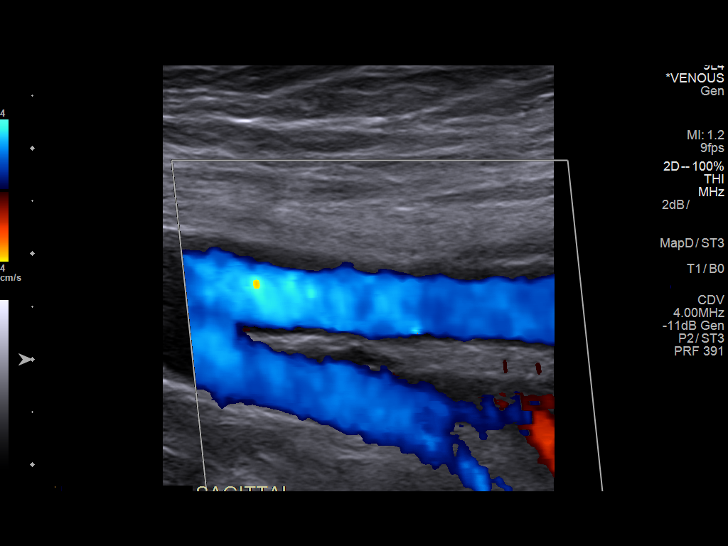
[im 26/46]
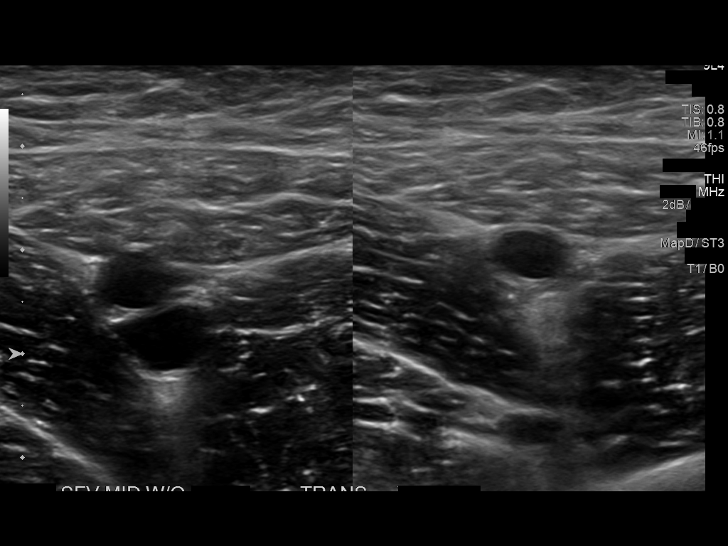
[im 30/46]
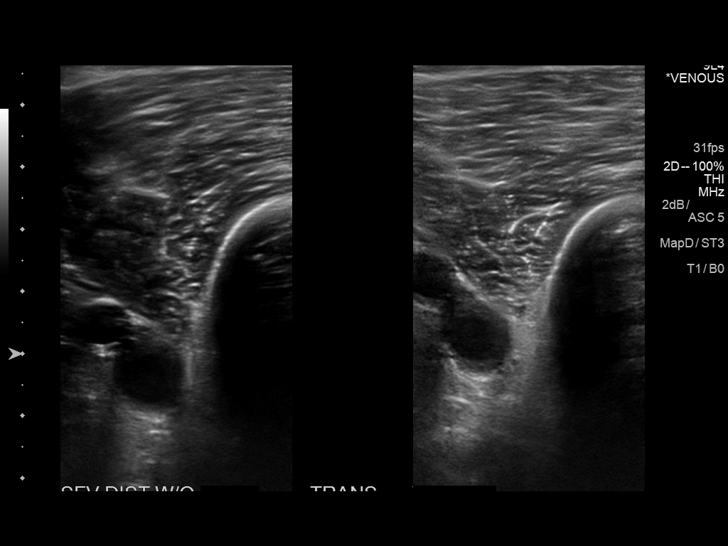
[im 34/46]
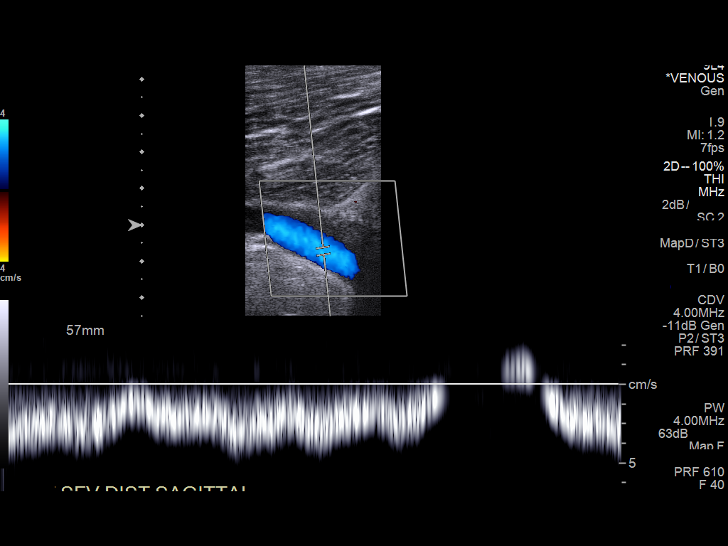
[im 38/46]
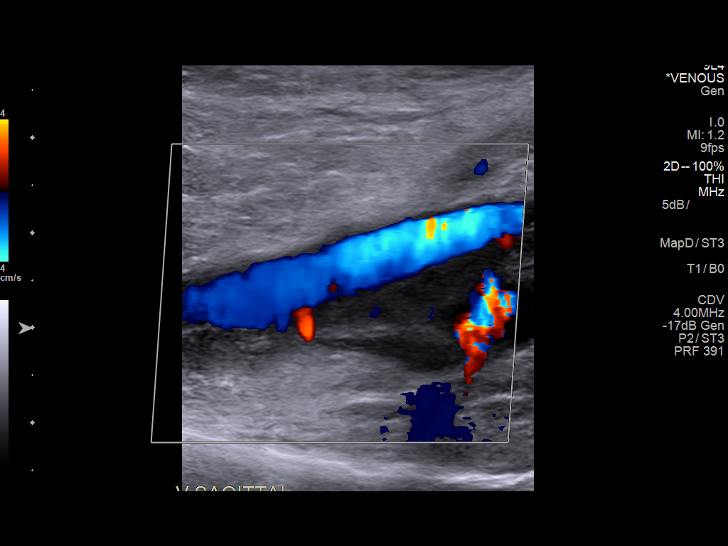
[im 42/46]
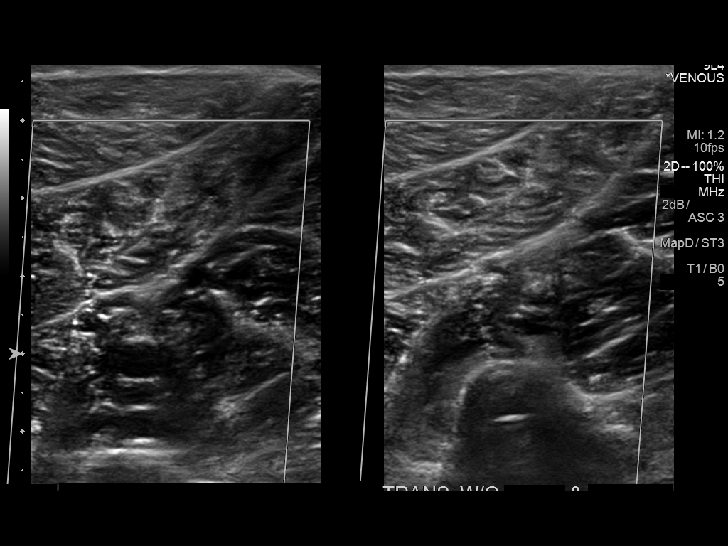
[im 46/46]
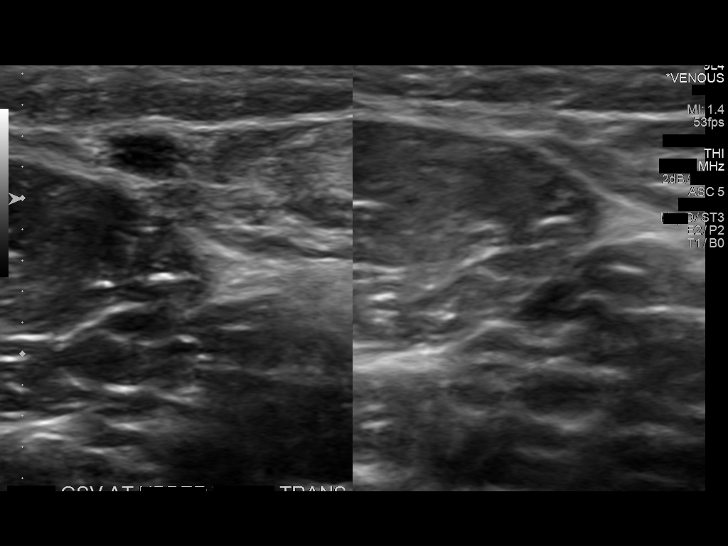

[12 of 24 positions shown; findings below may reference images not displayed]

FINDINGS: Contralateral Common Femoral Vein: Respiratory phasicity is normal
and symmetric with the symptomatic side. No evidence of thrombus.
Normal compressibility.

Common Femoral Vein: Similar appearance of eccentric wall thickening
consistent with partially recanalized or chronic DVT. The appearance
is similar to slightly improved compared to prior. Specifically, the
thickening is slightly less prominent an the residual thrombus
appears more echogenic. The vessel remains partially compressible
secondary to the wall thickening.

Saphenofemoral Junction: No evidence of thrombus. Normal
compressibility and flow on color Doppler imaging.

Profunda Femoral Vein: No evidence of thrombus. Normal
compressibility and flow on color Doppler imaging.

Femoral Vein: Normal compressibility and no evidence of thrombus in
the proximal and mid thigh. There is a suggestion of a mild
eccentric wall thickening in the distal femoral vein. The vessel is
incompletely compressible in this region.

Popliteal Vein: Eccentric wall thickening consistent with chronic
and largely recanalized thrombus. The vessel is partially
compressible. No evidence of acute thrombus.

Calf Veins: No evidence of thrombus. Normal compressibility and flow
on color Doppler imaging.

Superficial Great Saphenous Vein: No evidence of thrombus. Normal
compressibility and flow on color Doppler imaging.

Venous Reflux:  None.

Other Findings:  None.
IMPRESSION: 1. No evidence of acute DVT in the left lower extremity.
2. Similar to slightly improved eccentric wall thickening consistent
with largely recanalized chronic DVT in the left common femoral
vein.
3. Eccentric wall thickening consistent with largely recanalized
chronic DVT in the femoral vein in the distal thigh extending into
the popliteal vein. Although this was not described on the prior
ultrasound from 08/24/2014 that is likely due to differences in
technique between the 2 studies. This does not appear to represent
acute thrombus or progression of thrombus in the setting of
anticoagulation.

## 2015-11-08 ENCOUNTER — Telehealth: Payer: Self-pay

## 2015-11-08 NOTE — Telephone Encounter (Signed)
Received call from pt's father reporting that [place] will be [place] in [place] on [place] and questions what pt should do about his Eliquis. [name] states pt "got angry and punched his desk and chipped a bone off his pinky & it has to be repaired."   Per Dr Myna HidalgoEnnever, hold Eliquis beginning tomorrow Friday 10/20. Pt to resume Eliquis on Tuesday 10/24. Pt's father repeats back instructions and verbalizes understanding. dph

## 2015-11-09 ENCOUNTER — Telehealth: Payer: Self-pay | Admitting: *Deleted

## 2015-11-09 NOTE — Telephone Encounter (Signed)
Patient is having hand surgery on Monday. His father would like to know what over the counter pain medication he can take. He doesn't want narcotic medication.  Spoke to Dr Myna HidalgoEnnever. He doesn't want the patient to take anything with ibuprofen or aspirin. The patient can take tylenol.   Reviewed directions with patients father and he understood, confirmed with teach back.

## 2016-01-07 ENCOUNTER — Other Ambulatory Visit (HOSPITAL_BASED_OUTPATIENT_CLINIC_OR_DEPARTMENT_OTHER): Payer: BLUE CROSS/BLUE SHIELD

## 2016-01-07 ENCOUNTER — Ambulatory Visit (HOSPITAL_BASED_OUTPATIENT_CLINIC_OR_DEPARTMENT_OTHER)
Admission: RE | Admit: 2016-01-07 | Discharge: 2016-01-07 | Disposition: A | Discharge status: Home / Self Care | Payer: BLUE CROSS/BLUE SHIELD | Source: Ambulatory Visit | Attending: Family | Admitting: Family

## 2016-01-07 ENCOUNTER — Ambulatory Visit: Payer: BLUE CROSS/BLUE SHIELD | Admitting: Hematology & Oncology

## 2016-01-07 ENCOUNTER — Other Ambulatory Visit: Payer: Self-pay | Admitting: Family

## 2016-01-07 ENCOUNTER — Ambulatory Visit (HOSPITAL_BASED_OUTPATIENT_CLINIC_OR_DEPARTMENT_OTHER): Payer: BLUE CROSS/BLUE SHIELD | Admitting: Hematology & Oncology

## 2016-01-07 VITALS — BP 124/75 | HR 103 | Temp 97.9°F | Wt 179.8 lb

## 2016-01-07 DIAGNOSIS — I82432 Acute embolism and thrombosis of left popliteal vein: Secondary | ICD-10-CM

## 2016-01-07 DIAGNOSIS — D6862 Lupus anticoagulant syndrome: Secondary | ICD-10-CM | POA: Diagnosis not present

## 2016-01-07 DIAGNOSIS — I82412 Acute embolism and thrombosis of left femoral vein: Secondary | ICD-10-CM

## 2016-01-07 DIAGNOSIS — D68312 Antiphospholipid antibody with hemorrhagic disorder: Secondary | ICD-10-CM

## 2016-01-07 LAB — CBC WITH DIFFERENTIAL (CANCER CENTER ONLY)
BASO#: 0 10*3/uL (ref 0.0–0.2)
BASO%: 0.4 % (ref 0.0–2.0)
EOS%: 3.3 % (ref 0.0–7.0)
Eosinophils Absolute: 0.2 10*3/uL (ref 0.0–0.5)
HEMATOCRIT: 43.8 % (ref 38.7–49.9)
HGB: 15.1 g/dL (ref 13.0–17.1)
LYMPH#: 1.9 10*3/uL (ref 0.9–3.3)
LYMPH%: 33.8 % (ref 14.0–48.0)
MCH: 31.1 pg (ref 28.0–33.4)
MCHC: 34.5 g/dL (ref 32.0–35.9)
MCV: 90 fL (ref 82–98)
MONO#: 0.3 10*3/uL (ref 0.1–0.9)
MONO%: 6.2 % (ref 0.0–13.0)
NEUT%: 56.3 % (ref 40.0–80.0)
NEUTROS ABS: 3.1 10*3/uL (ref 1.5–6.5)
PLATELETS: 227 10*3/uL (ref 145–400)
RBC: 4.86 10*6/uL (ref 4.20–5.70)
RDW: 12.8 % (ref 11.1–15.7)
WBC: 5.5 10*3/uL (ref 4.0–10.0)

## 2016-01-07 LAB — COMPREHENSIVE METABOLIC PANEL
ALT: 16 U/L (ref 0–55)
AST: 13 U/L (ref 5–34)
Albumin: 3.9 g/dL (ref 3.5–5.0)
Alkaline Phosphatase: 89 U/L (ref 40–150)
Anion Gap: 9 mEq/L (ref 3–11)
BUN: 12.3 mg/dL (ref 7.0–26.0)
CHLORIDE: 104 meq/L (ref 98–109)
CO2: 25 meq/L (ref 22–29)
CREATININE: 1.2 mg/dL (ref 0.7–1.3)
Calcium: 9.8 mg/dL (ref 8.4–10.4)
EGFR: 88 mL/min/{1.73_m2} — ABNORMAL LOW (ref >90)
GLUCOSE: 126 mg/dL (ref 70–140)
Potassium: 4.1 mEq/L (ref 3.5–5.1)
SODIUM: 138 meq/L (ref 136–145)
Total Bilirubin: 0.74 mg/dL (ref 0.20–1.20)
Total Protein: 7.7 g/dL (ref 6.4–8.3)

## 2016-01-08 ENCOUNTER — Encounter: Payer: Self-pay | Admitting: Family Medicine

## 2016-01-08 NOTE — Progress Notes (Signed)
Hematology and Oncology Follow Up Visit  Bryan Robbins [id number] 12/30/1995 20 y.o. 01/08/2016   Principle Diagnosis:   Left lower extremity thromboembolic disease  Positive lupus anticoagulant  Current Therapy:    ELIQUIS 5 mg by mouth twice a day  Aspirin 81 mg by mouth daily     Interim History:  [name] is back for follow-up. He is on his Christmas break. He had a preterm semester. He took some tough classes.  We did go ahead and repeat a Doppler of his left leg. Unfortunately, it looks like there might be progression of the popliteal thrombus.. Is nonocclusive. It is felt this is an acute on chronic process. No other abnormalities are noted.  We his recent down to 2.5 mg twice a would last saw him back in August. As such, I think we're going to have to get him on full dose and keep him on full dose. He's not noted any swelling in the left leg. He does have a compression stocking. However this appears to be not all that tight. I gave him the address of a place to go to that can actually give him more therapeutic compression stockings.  Only last saw him in August, there is no detectable lupus anticoagulant.  He's had no problems with bleeding or bruising. He's had no rashes. He's had no change in bowel or bladder habits.  He's been taking his medicine daily. He's been doing well with the baby aspirin.    Overall, his performance status is ECOG 0.  Medications:  Current Outpatient Prescriptions:  .  apixaban (ELIQUIS) 5 MG TABS tablet, Take 1 tablet (5 mg total) by mouth 2 (two) times daily. (Patient taking differently: Take 2.5 mg by mouth 2 (two) times daily. ), Disp: 14 tablet, Rfl: 0 .  aspirin 81 MG tablet, Take 81 mg by mouth daily., Disp: , Rfl:   Allergies:  Allergies  Allergen Reactions  . Penicillins Hives    Past Medical History, Surgical history, Social history, and Family History were reviewed and updated.  Review of Systems: As above  Physical  Exam:  weight is 179 lb 12.8 oz (81.6 kg). His oral temperature is 97.9 F (36.6 C). His blood pressure is 124/75 and his pulse is 103 (abnormal).   Wt Readings from Last 3 Encounters:  01/07/16 179 lb 12.8 oz (81.6 kg)  08/28/15 175 lb (79.4 kg)  03/27/15 177 lb (80.3 kg)     Well-developed and well-nourished white male in no obvious distress. Vital signs are stable. Head and neck exam shows no ocular or oral lesions. There are no palpable cervical or supraclavicular lymph nodes. Lungs are clear. Cardiac exam regular rate and rhythm with no murmurs, rubs or bruits. Abdomen is soft. Has good bowel sounds. There is no fluid wave. There is no palpable liver or spleen tip. Back exam shows no tenderness over the spine, ribs or hips. Extremities shows no clubbing, cyanosis or edema. No venous cord is noted in the left leg. Has a negative Homans sign. He has good pulses in his distal extremities. Skin exam shows no rashes, ecchymoses or petechia. Neurological exam is nonfocal.  Lab Results  Component Value Date   WBC 5.5 01/07/2016   HGB 15.1 01/07/2016   HCT 43.8 01/07/2016   MCV 90 01/07/2016   PLT 227 01/07/2016     Chemistry      Component Value Date/Time   NA 138 01/07/2016 0928   K 4.1 01/07/2016 0928   CL  99 01/26/2015 1424   CL 100 10/27/2014 0955   CO2 25 01/07/2016 0928   BUN 12.3 01/07/2016 0928   CREATININE 1.2 01/07/2016 0928      Component Value Date/Time   CALCIUM 9.8 01/07/2016 0928   ALKPHOS 89 01/07/2016 0928   AST 13 01/07/2016 0928   ALT 16 01/07/2016 0928   BILITOT 0.74 01/07/2016 0928         Impression and Plan: [name] is a 20 year old white male. He has a thrombus in the left leg. He does have a positive lupus anticoagulant. I told him that is probable that the lupus anticoagulant will be intermittent. However, I think they probably will need aspirin lifelong.  Again, we will have to increase his ELIQUIS back up to 5 mg twice a day. I think this is  necessary. We probably will not keep on this for quite a while. I would hate to commit him to lifelong anticoagulation but I will think we have a choice right now. He clearly is showing us that he wants to become thrombotic if necessary.  I spent about 30 minutes with he and his dad. I certainly was not expecting this on the Doppler.  At some point, we probably will have to repeat the Doppler. I would do this in about 3 months.  I will see him back in 6 week.   Josph MachoENNEVER,Bryan Balan R, MD 12/19/20176:57 AM

## 2016-01-09 LAB — LUPUS ANTICOAGULANT PANEL
DRVVT CONFIRM: 1.7 ratio — AB (ref 0.8–1.2)
PTT-LA: 47.9 s (ref 0.0–51.9)
dRVVT Mix: 53.9 s — ABNORMAL HIGH (ref 0.0–47.0)
dRVVT: 90.6 s — ABNORMAL HIGH (ref 0.0–47.0)

## 2016-01-15 ENCOUNTER — Telehealth: Payer: Self-pay | Admitting: *Deleted

## 2016-01-15 NOTE — Telephone Encounter (Signed)
Patient c/o right sided abdominal pain. He describes it as a 'stomache ache'. This has been going on for 2-3 days, but he feels like it might be getting better. He thinks it might be worse after eating. He also admits to drinking and eating poorly with the holidays. He denies fever, n/v, pain at other sites, or any signs of bleeding.  Reviewed all symptoms with Dr Myna HidalgoEnnever. He doesn't feel they are related to his DVT or treatment. He recommends pepto-bismol for symptoms and if he has any further needs he should contact his PCP.   Patient and his father understand instructions

## 2016-02-22 ENCOUNTER — Ambulatory Visit (HOSPITAL_BASED_OUTPATIENT_CLINIC_OR_DEPARTMENT_OTHER): Payer: BLUE CROSS/BLUE SHIELD | Admitting: Family

## 2016-02-22 ENCOUNTER — Other Ambulatory Visit: Payer: Self-pay | Admitting: Family

## 2016-02-22 ENCOUNTER — Other Ambulatory Visit (HOSPITAL_BASED_OUTPATIENT_CLINIC_OR_DEPARTMENT_OTHER): Payer: BLUE CROSS/BLUE SHIELD

## 2016-02-22 VITALS — BP 136/71 | HR 84 | Temp 98.1°F | Wt 181.4 lb

## 2016-02-22 DIAGNOSIS — I82432 Acute embolism and thrombosis of left popliteal vein: Secondary | ICD-10-CM

## 2016-02-22 DIAGNOSIS — D6862 Lupus anticoagulant syndrome: Secondary | ICD-10-CM

## 2016-02-22 DIAGNOSIS — I82412 Acute embolism and thrombosis of left femoral vein: Secondary | ICD-10-CM

## 2016-02-22 DIAGNOSIS — Z7901 Long term (current) use of anticoagulants: Secondary | ICD-10-CM | POA: Diagnosis not present

## 2016-02-22 LAB — CBC WITH DIFFERENTIAL (CANCER CENTER ONLY)
BASO#: 0 10*3/uL (ref 0.0–0.2)
BASO%: 0.3 % (ref 0.0–2.0)
EOS ABS: 0.1 10*3/uL (ref 0.0–0.5)
EOS%: 1.6 % (ref 0.0–7.0)
HCT: 41.4 % (ref 38.7–49.9)
HGB: 14.7 g/dL (ref 13.0–17.1)
LYMPH#: 2 10*3/uL (ref 0.9–3.3)
LYMPH%: 29.5 % (ref 14.0–48.0)
MCH: 31.3 pg (ref 28.0–33.4)
MCHC: 35.5 g/dL (ref 32.0–35.9)
MCV: 88 fL (ref 82–98)
MONO#: 0.5 10*3/uL (ref 0.1–0.9)
MONO%: 7.2 % (ref 0.0–13.0)
NEUT#: 4.2 10*3/uL (ref 1.5–6.5)
NEUT%: 61.4 % (ref 40.0–80.0)
PLATELETS: 210 10*3/uL (ref 145–400)
RBC: 4.7 10*6/uL (ref 4.20–5.70)
RDW: 12.1 % (ref 11.1–15.7)
WBC: 6.8 10*3/uL (ref 4.0–10.0)

## 2016-02-22 LAB — COMPREHENSIVE METABOLIC PANEL (CC13)
A/G RATIO: 1.5 (ref 1.2–2.2)
ALT: 30 IU/L (ref 0–44)
AST: 25 IU/L (ref 0–40)
Albumin, Serum: 4.9 g/dL (ref 3.5–5.5)
Alkaline Phosphatase, S: 90 IU/L (ref 39–117)
BUN/Creatinine Ratio: 12 (ref 9–20)
BUN: 11 mg/dL (ref 6–20)
Bilirubin Total: 0.6 mg/dL (ref 0.0–1.2)
CALCIUM: 9.4 mg/dL (ref 8.7–10.2)
CO2: 25 mmol/L (ref 18–29)
CREATININE: 0.95 mg/dL (ref 0.76–1.27)
Chloride, Ser: 97 mmol/L (ref 96–106)
GFR calc Af Amer: 131 mL/min/{1.73_m2} (ref >59)
GFR, EST NON AFRICAN AMERICAN: 113 mL/min/{1.73_m2} (ref >59)
Globulin, Total: 3.3 g/dL (ref 1.5–4.5)
Glucose: 143 mg/dL — ABNORMAL HIGH (ref 65–99)
POTASSIUM: 3.9 mmol/L (ref 3.5–5.2)
Sodium: 132 mmol/L — ABNORMAL LOW (ref 134–144)
TOTAL PROTEIN: 8.2 g/dL (ref 6.0–8.5)

## 2016-02-23 LAB — D-DIMER, QUANTITATIVE (NOT AT ARMC)

## 2016-02-23 NOTE — Progress Notes (Signed)
Hematology and Oncology Follow Up Visit  Bryan Robbins [id number] 08-24-1995 21 y.o. 02/23/2016   Principle Diagnosis:  Recurrent left lower extremity thromboembolic disease Positive lupus anticoagulant  Current Therapy:   ELIQUIS 5 mg by mouth twice a day - lifelong Aspirin 81 mg by mouth daily    Interim History:  Bryan Robbins is here today with Bryan Robbins dad for a follow-up. Bryan Robbins is doing well but does have occasional swelling the left leg when Bryan Robbins is on Bryan Robbins feet for an extended period of time. Bryan Robbins wears Bryan Robbins compression stocking regularly for support which does help.  Both lower extremities are warm with +2 pedal pulses. No swelling noted at this time.  Bryan Robbins verbalized that Bryan Robbins is taking Bryan Robbins baby aspirin daily as well as hid Eliquis 5 mg PO daily.  No SOB, fever, chills, n/v, cough, rash, dizziness, headache, blurred vision, chest pain, palpitations, abdominal pain or changes in bowel or bladder habits.  No episodes of bleeding or bruising while on anticoagulation.   Bryan Robbins has maintained a good appetite and is staying well hydrated. Bryan Robbins weight is stable.   Medications:  Allergies as of 02/22/2016      Reactions   Penicillins Hives      Medication List       Accurate as of 02/22/16 11:59 PM. Always use your most recent med list.          apixaban 5 MG Tabs tablet Commonly known as:  ELIQUIS Take 1 tablet (5 mg total) by mouth 2 (two) times daily.   aspirin 81 MG tablet Take 81 mg by mouth daily.       Allergies:  Allergies  Allergen Reactions  . Penicillins Hives    Past Medical History, Surgical history, Social history, and Family History were reviewed and updated.  Review of Systems: All other 10 point review of systems is negative.   Physical Exam:  weight is 181 lb 6.4 oz (82.3 kg). Bryan Robbins oral temperature is 98.1 F (36.7 C). Bryan Robbins blood pressure is 136/71 and Bryan Robbins pulse is 84.   Wt Readings from Last 3 Encounters:  02/22/16 181 lb 6.4 oz (82.3 kg)  01/07/16 179 lb 12.8 oz  (81.6 kg)  08/28/15 175 lb (79.4 kg)    Ocular: Sclerae unicteric, pupils equal, round and reactive to light Ear-nose-throat: Oropharynx clear, dentition fair Lymphatic: No cervical supraclavicular or axillary adenopathy Lungs no rales or rhonchi, good excursion bilaterally Heart regular rate and rhythm, no murmur appreciated Abd soft, nontender, positive bowel sounds, no liver or spleen tip palpated on exam MSK no focal spinal tenderness, no joint edema Neuro: non-focal, well-oriented, appropriate affect Breasts: Deferred  Lab Results  Component Value Date   WBC 6.8 02/22/2016   HGB 14.7 02/22/2016   HCT 41.4 02/22/2016   MCV 88 02/22/2016   PLT 210 02/22/2016   No results found for: FERRITIN, IRON, TIBC, UIBC, IRONPCTSAT Lab Results  Component Value Date   RBC 4.70 02/22/2016   No results found for: KPAFRELGTCHN, LAMBDASER, KAPLAMBRATIO No results found for: IGGSERUM, IGA, IGMSERUM No results found for: Georgann Housekeeper, MSPIKE, SPEI   Chemistry      Component Value Date/Time   NA 132 (L) 02/22/2016 1522   NA 138 01/07/2016 0928   K 3.9 02/22/2016 1522   K 4.1 01/07/2016 0928   CL 97 02/22/2016 1522   CL 100 10/27/2014 0955   CO2 25 02/22/2016 1522   CO2 25 01/07/2016 0928   BUN 11  02/22/2016 1522   BUN 12.3 01/07/2016 0928   CREATININE 0.95 02/22/2016 1522   CREATININE 1.2 01/07/2016 0928      Component Value Date/Time   CALCIUM 9.4 02/22/2016 1522   CALCIUM 9.8 01/07/2016 0928   ALKPHOS 90 02/22/2016 1522   ALKPHOS 89 01/07/2016 0928   AST 25 02/22/2016 1522   AST 13 01/07/2016 0928   ALT 30 02/22/2016 1522   ALT 16 01/07/2016 0928   BILITOT 0.6 02/22/2016 1522   BILITOT 0.74 01/07/2016 0928     Impression and Plan: Bryan Robbins is a 21 yo white male with a recurrent thrombus of the left leg. Bryan Robbins is positive for the lupus anticoagulant and is now on lifelong anticoagulation with Eliquis and aspirin. Bryan Robbins has responded  nicely and has no complaints at this time. Bryan Robbins will continue to wear Bryan Robbins compression stocking and stay well hydrated.  We will repeat a doppler on Bryan Robbins leg in 6-8 weeks at Bryan Robbins next follow-up. We will plan to see him back during Bryan Robbins spring break from school.  Both Bryan Robbins and Bryan Robbins parents know to contact us with any questions or concerns. We can certainly see hi sooner if need be.   Verdie Mosher[place],Amaris Delafuente M, NP 2/3/20189:15 AM

## 2016-02-24 ENCOUNTER — Encounter: Payer: Self-pay | Admitting: Family Medicine

## 2016-03-27 IMAGING — US US EXTREM LOW VENOUS*L*
1 series · 13 of 24 positions shown · non-contrast
Comparison: Left lower extremity venous Doppler ultrasound -
01/20/2015; 10/27/2014

CLINICAL DATA: Follow-up left lower extremity DVT. History of
lupus.



[Series 1: us extrem low venous*left* · 0.05mm/px · 13 of 30 slices shown]
[im 1/30]
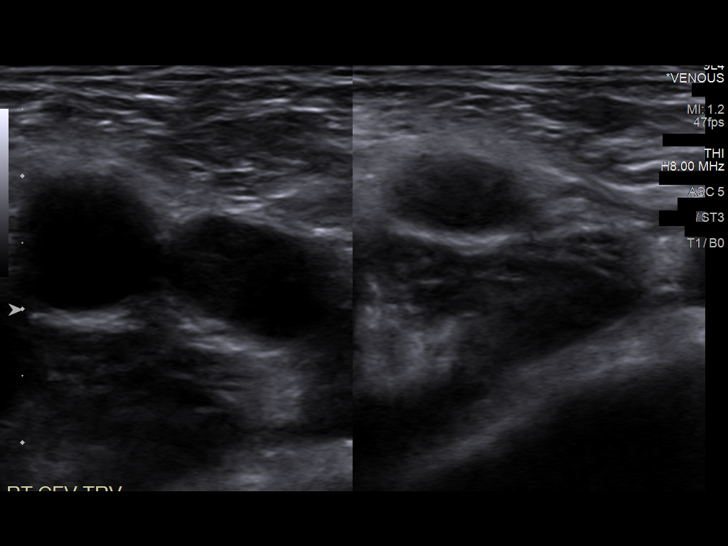
[im 3/30]
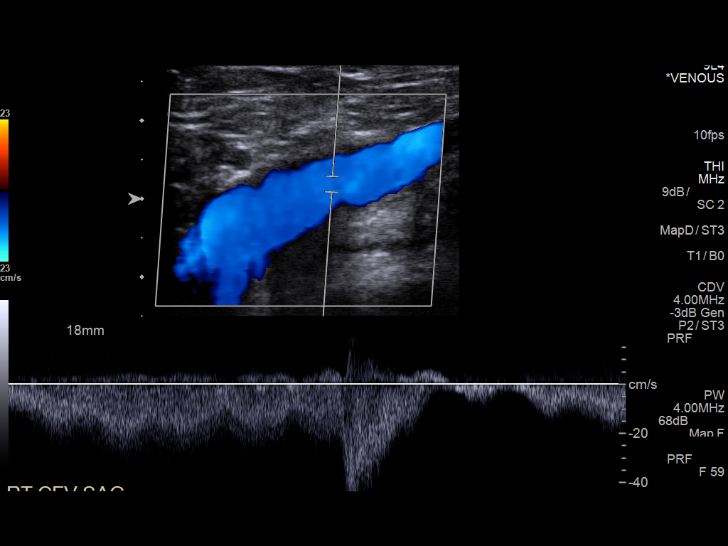
[im 6/30]
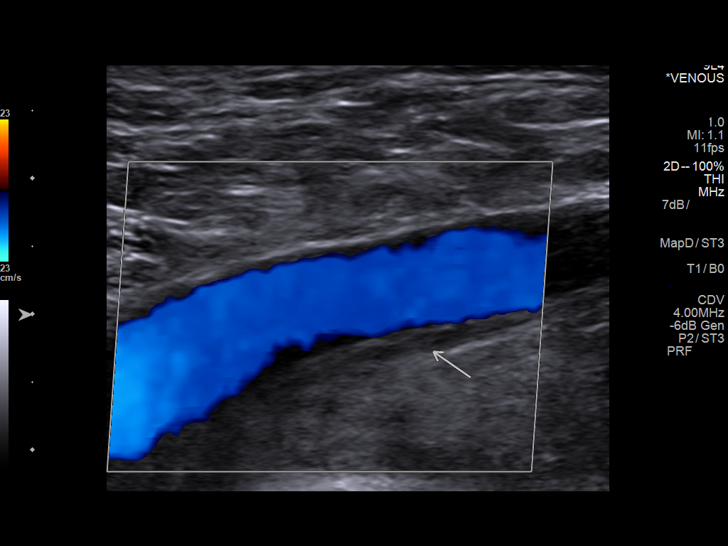
[im 8/30]
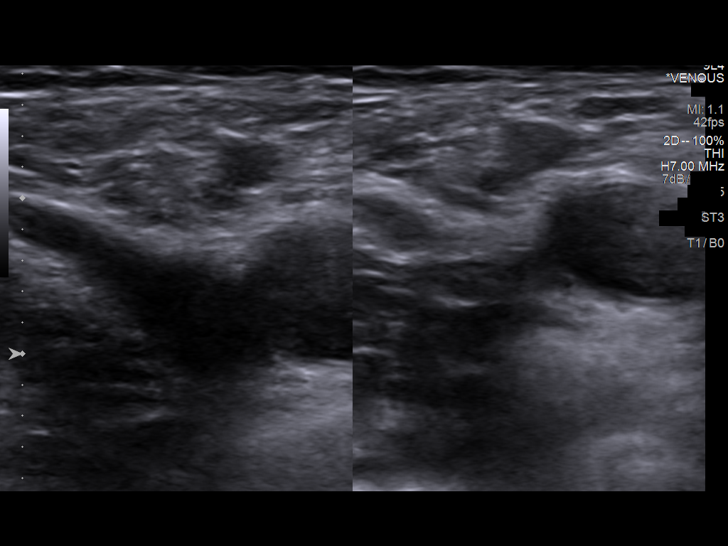
[im 11/30]
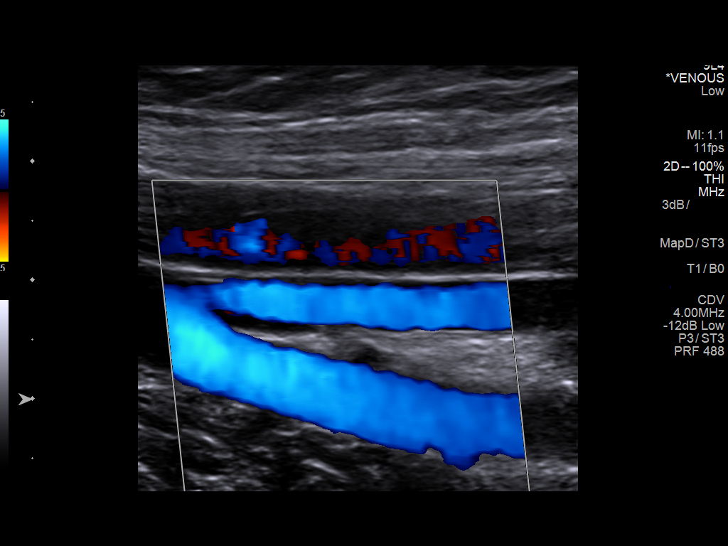
[im 13/30]
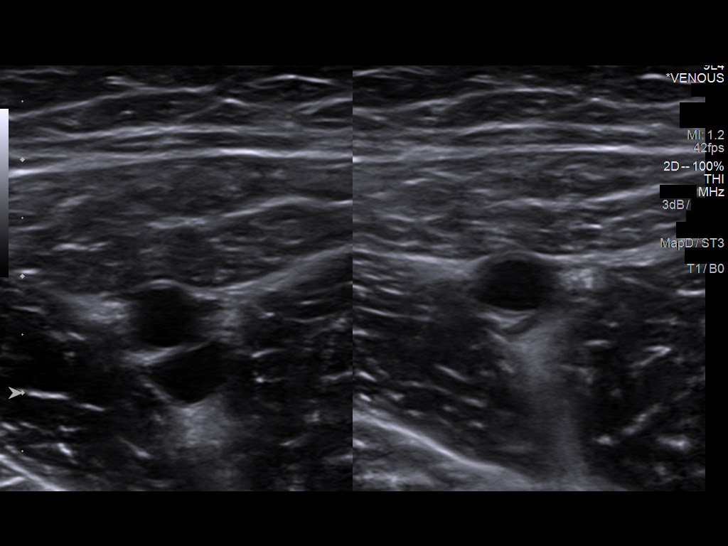
[im 16/30]
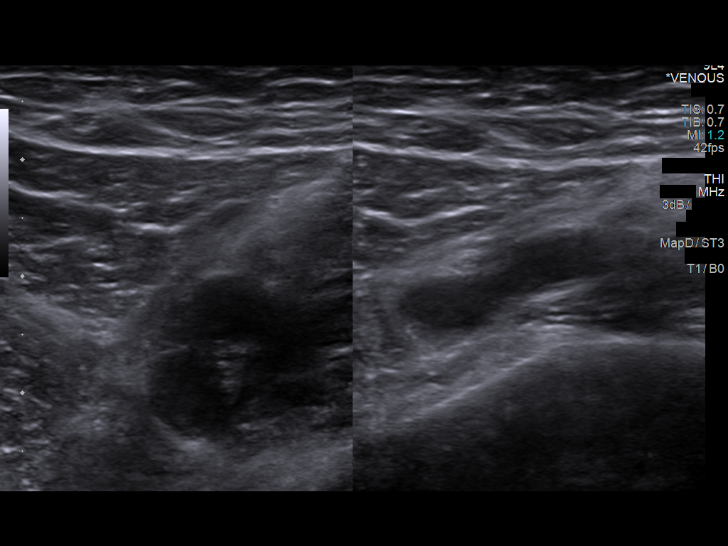
[im 17/30]
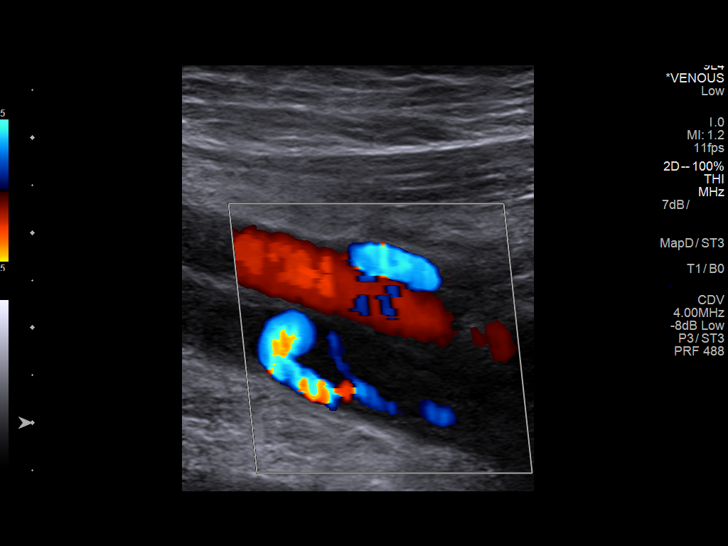
[im 19/30]
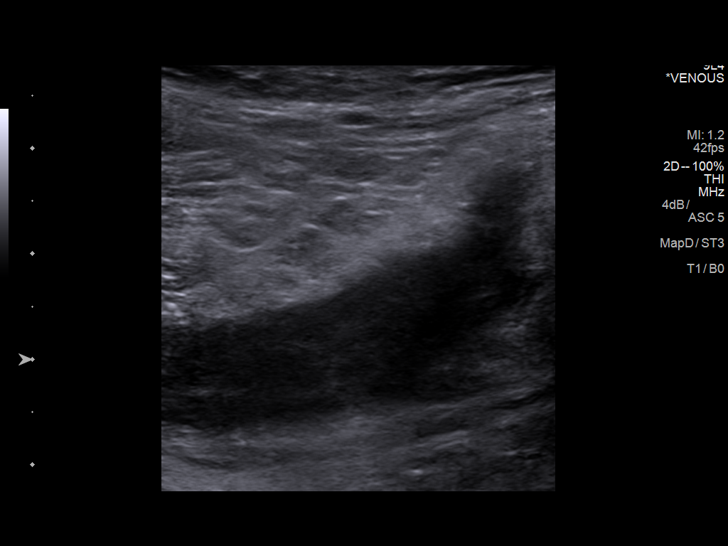
[im 22/30]
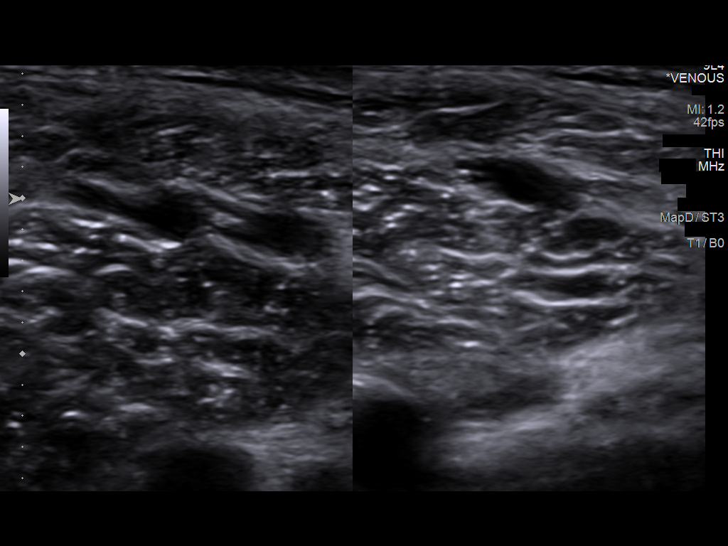
[im 24/30]
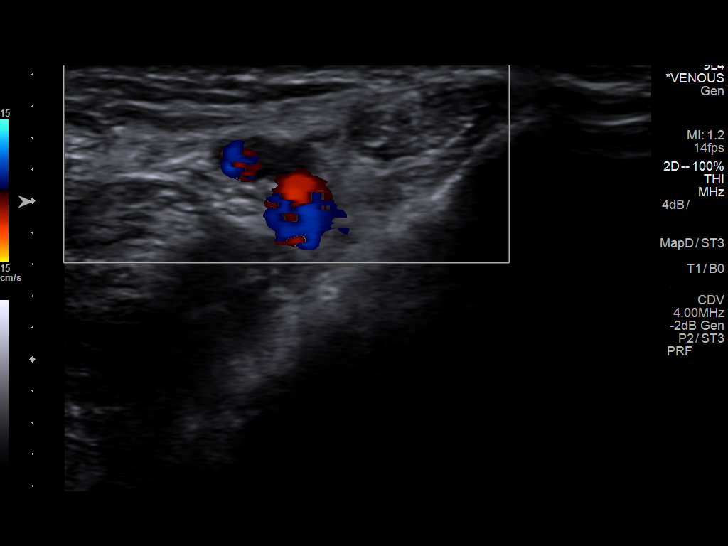
[im 27/30]
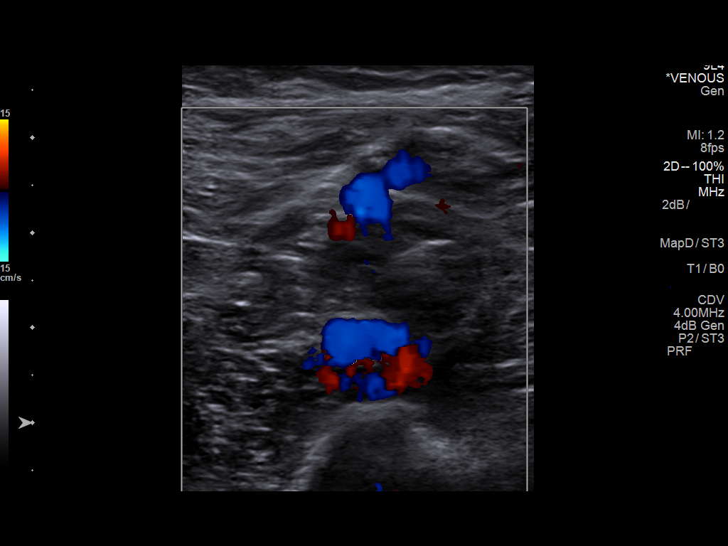
[im 30/30]
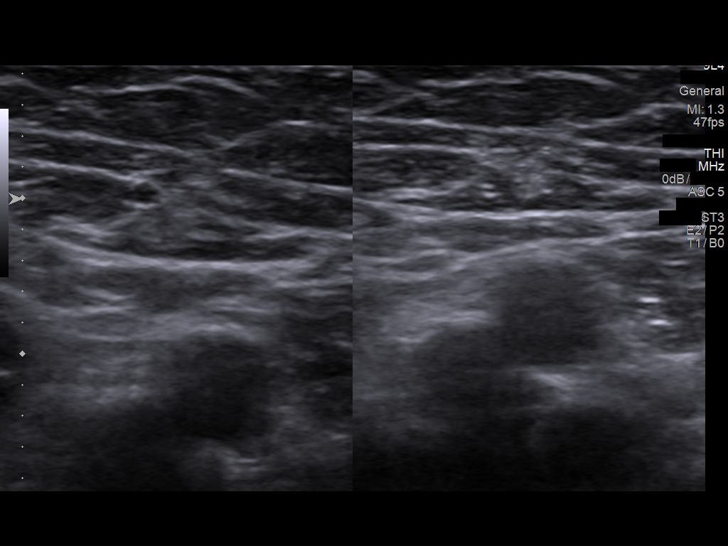

[13 of 24 positions shown; findings below may reference images not displayed]

FINDINGS: Contralateral Common Femoral Vein: Respiratory phasicity is normal
and symmetric with the symptomatic side. No evidence of thrombus.
Normal compressibility.

Common Femoral Vein: Chronic nonocclusive wall thickening within the
left common femoral vein (representative image 6), similar to the
[DATE] examination.

Saphenofemoral Junction: No evidence of thrombus. Normal
compressibility and flow on color Doppler imaging.

Profunda Femoral Vein: No evidence of thrombus. Normal
compressibility and flow on color Doppler imaging.

Femoral Vein: Mixed echogenic near occlusive DVT within the distal
aspect of the left femoral vein (representative image 19, minimally
improved compared to the 01/20/2015 examination.

Popliteal Vein: Mixed echogenic near occlusive DVT within the
popliteal vein (representative image 22), minimally improved
compared to the [DATE] examination.

Calf Veins: Appear patent where imaged. Normal compressibility and
flow on color Doppler imaging.

Superficial Great Saphenous Vein: No evidence of thrombus. Normal
compressibility and flow on color Doppler imaging.

Venous Reflux:  None.

Other Findings:  None.
IMPRESSION: 1. No definite evidence of superimposed acute DVT within the left
lower extremity.
2. Chronic near occlusive DVT within distal aspect of the femoral
vein extending through the popliteal vein, very minimally improved
compared to the 01/20/2015 examination.
3. Chronic wall thickening within the left common femoral vein,
similar to the [DATE] examination.

## 2016-04-18 ENCOUNTER — Ambulatory Visit (HOSPITAL_BASED_OUTPATIENT_CLINIC_OR_DEPARTMENT_OTHER): Payer: BLUE CROSS/BLUE SHIELD | Admitting: Hematology & Oncology

## 2016-04-18 ENCOUNTER — Other Ambulatory Visit (HOSPITAL_BASED_OUTPATIENT_CLINIC_OR_DEPARTMENT_OTHER): Payer: BLUE CROSS/BLUE SHIELD

## 2016-04-18 VITALS — BP 127/72 | HR 89 | Temp 97.7°F | Resp 16 | Wt 178.0 lb

## 2016-04-18 DIAGNOSIS — D6862 Lupus anticoagulant syndrome: Secondary | ICD-10-CM

## 2016-04-18 DIAGNOSIS — I82402 Acute embolism and thrombosis of unspecified deep veins of left lower extremity: Secondary | ICD-10-CM

## 2016-04-18 DIAGNOSIS — I82412 Acute embolism and thrombosis of left femoral vein: Secondary | ICD-10-CM

## 2016-04-18 DIAGNOSIS — I82419 Acute embolism and thrombosis of unspecified femoral vein: Secondary | ICD-10-CM

## 2016-04-18 LAB — CBC WITH DIFFERENTIAL (CANCER CENTER ONLY)
BASO#: 0 10*3/uL (ref 0.0–0.2)
BASO%: 0.2 % (ref 0.0–2.0)
EOS%: 2.3 % (ref 0.0–7.0)
Eosinophils Absolute: 0.1 10*3/uL (ref 0.0–0.5)
HCT: 40.7 % (ref 38.7–49.9)
HGB: 14.3 g/dL (ref 13.0–17.1)
LYMPH#: 1.9 10*3/uL (ref 0.9–3.3)
LYMPH%: 36.3 % (ref 14.0–48.0)
MCH: 31.3 pg (ref 28.0–33.4)
MCHC: 35.1 g/dL (ref 32.0–35.9)
MCV: 89 fL (ref 82–98)
MONO#: 0.6 10*3/uL (ref 0.1–0.9)
MONO%: 10.7 % (ref 0.0–13.0)
NEUT#: 2.6 10*3/uL (ref 1.5–6.5)
NEUT%: 50.5 % (ref 40.0–80.0)
PLATELETS: 206 10*3/uL (ref 145–400)
RBC: 4.57 10*6/uL (ref 4.20–5.70)
RDW: 12.2 % (ref 11.1–15.7)
WBC: 5.2 10*3/uL (ref 4.0–10.0)

## 2016-04-18 LAB — COMPREHENSIVE METABOLIC PANEL (CC13)
ALK PHOS: 79 IU/L (ref 39–117)
ALT: 12 IU/L (ref 0–44)
AST (SGOT): 13 IU/L (ref 0–40)
Albumin, Serum: 4.6 g/dL (ref 3.5–5.5)
Albumin/Globulin Ratio: 1.5 (ref 1.2–2.2)
BILIRUBIN TOTAL: 0.4 mg/dL (ref 0.0–1.2)
BUN / CREAT RATIO: 13 (ref 9–20)
BUN: 13 mg/dL (ref 6–20)
CHLORIDE: 101 mmol/L (ref 96–106)
Calcium, Ser: 9.6 mg/dL (ref 8.7–10.2)
Carbon Dioxide, Total: 27 mmol/L (ref 18–29)
Creatinine, Ser: 1.03 mg/dL (ref 0.76–1.27)
GFR calc Af Amer: 119 mL/min/{1.73_m2} (ref >59)
GFR calc non Af Amer: 103 mL/min/{1.73_m2} (ref >59)
GLUCOSE: 103 mg/dL — AB (ref 65–99)
Globulin, Total: 3 g/dL (ref 1.5–4.5)
POTASSIUM: 4.1 mmol/L (ref 3.5–5.2)
Sodium: 136 mmol/L (ref 134–144)
Total Protein: 7.6 g/dL (ref 6.0–8.5)

## 2016-04-18 NOTE — Progress Notes (Signed)
Hematology and Oncology Follow Up Visit  Kerrington Greenhalgh [id number] 05-26-1995 21 y.o. 04/18/2016   Principle Diagnosis:   Left lower extremity thromboembolic disease  Positive lupus anticoagulant  Current Therapy:    ELIQUIS 5 mg by mouth twice a day  Aspirin 81 mg by mouth daily     Interim History:  Mr. Geerts is back for follow-up. He is doing quite well. 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 He wants to do an internship this summer.  He's had no problems with his left leg. He does wear a compression stocking. He is doing some running.  He does take his aspirin. He does have the lupus anticoagulant which has been positive on several tests.  He has had no bleeding. He is eating well. He has had no cough. He has had no infections. He has had no rashes.  Overall, his performance status is ECOG 0.  Medications:  Current Outpatient Prescriptions:  .  apixaban (ELIQUIS) 5 MG TABS tablet, Take 1 tablet (5 mg total) by mouth 2 (two) times daily. (Patient taking differently: Take 2.5 mg by mouth 2 (two) times daily. ), Disp: 14 tablet, Rfl: 0 .  aspirin 81 MG tablet, Take 81 mg by mouth daily., Disp: , Rfl:  .  ranitidine (ZANTAC) 150 MG capsule, Take 150 mg by mouth as needed for heartburn., Disp: , Rfl:   Allergies:  Allergies  Allergen Reactions  . Penicillins Hives    Past Medical History, Surgical history, Social history, and Family History were reviewed and updated.  Review of Systems: As above  Physical Exam:  weight is 178 lb (80.7 kg). His oral temperature is 97.7 F (36.5 C). His blood pressure is 127/72 and his pulse is 89. His respiration is 16 and oxygen saturation is 100%.   Wt Readings from Last 3 Encounters:  04/18/16 178 lb (80.7 kg)  02/22/16 181 lb 6.4 oz (82.3 kg)  01/07/16 179 lb 12.8 oz (81.6 kg)     Well-developed and well-nourished white male in no obvious distress. Vital signs are stable. Head and neck exam shows  no ocular or oral lesions. There are no palpable cervical or supraclavicular lymph nodes. Lungs are clear. Cardiac exam regular rate and rhythm with no murmurs, rubs or bruits. Abdomen is soft. Has good bowel sounds. There is no fluid wave. There is no palpable liver or spleen tip. Back exam shows no tenderness over the spine, ribs or hips. Extremities shows no clubbing, cyanosis or edema. No venous cord is noted in the left leg. Has a negative Homans sign. He has good pulses in his distal extremities. Skin exam shows no rashes, ecchymoses or petechia. Neurological exam is nonfocal.  Lab Results  Component Value Date   WBC 5.2 04/18/2016   HGB 14.3 04/18/2016   HCT 40.7 04/18/2016   MCV 89 04/18/2016   PLT 206 04/18/2016     Chemistry      Component Value Date/Time   NA 132 (L) 02/22/2016 1522   NA 138 01/07/2016 0928   K 3.9 02/22/2016 1522   K 4.1 01/07/2016 0928   CL 97 02/22/2016 1522   CL 100 10/27/2014 0955   CO2 25 02/22/2016 1522   CO2 25 01/07/2016 0928   BUN 11 02/22/2016 1522   BUN 12.3 01/07/2016 0928   CREATININE 0.95 02/22/2016 1522   CREATININE 1.2 01/07/2016 0928      Component Value Date/Time   CALCIUM 9.4 02/22/2016  1522   CALCIUM 9.8 01/07/2016 0928   ALKPHOS 90 02/22/2016 1522   ALKPHOS 89 01/07/2016 0928   AST 25 02/22/2016 1522   AST 13 01/07/2016 0928   ALT 30 02/22/2016 1522   ALT 16 01/07/2016 0928   BILITOT 0.6 02/22/2016 1522   BILITOT 0.74 01/07/2016 0928         Impression and Plan: Mr. Crutchfield is a 21 year old white male. He has a thrombus in the left leg. He does have a positive lupus anticoagulant. I told him that is probable that the lupus anticoagulant will be intermittent. However, I think he probably will need lifelong anticoagulation.   I think that we can get him back in 6 months. Everything is very stable., Sure how much we would be changing his overall management if we saw him earlier. I don't want to interfere with him getting an  internship.  He was no set if there is a problem, he can was come back and we can do the appropriate tests.   Both he and his father are okay with I recommendation.    Josph Macho, MD 3/30/20182:17 PM

## 2016-04-19 LAB — D-DIMER, QUANTITATIVE: D-DIMER: <0.2 mg/L FEU (ref 0.00–0.49)

## 2016-06-17 IMAGING — CT CT CTA ABD/PEL W/CM AND/OR W/O CM
2 of 6 series · 14 of 46 positions shown, 18 images · IV contrast (APPLIED)
Comparison: Left lower extremity venous duplex examination from
06/21/2014, Cardiovascular [HOSPITAL] [facility].

CLINICAL DATA: New thrombus in the left iliac vein. Evaluate for
Gary La Luz Del syndrome.

EXAM:
CT ANGIOGRAPHY ABDOMEN AND PELVIS
TECHNIQUE: Multidetector CT imaging of the abdomen and pelvis was performed
using the standard protocol during bolus administration of
intravenous contrast. Multiplanar reconstructed images including
MIPs were obtained and reviewed to evaluate the vascular anatomy.
Images were obtained in order to evaluate the venous anatomy.
CONTRAST:  100 mL Omnipaque 350

[Series 3: a/p with 5.0 b31f · axial · 0.72mm/px · z∈[+206,+626]mm · 11 of 96 slices shown, 15 images]
[im 8/96  soft-tissue]
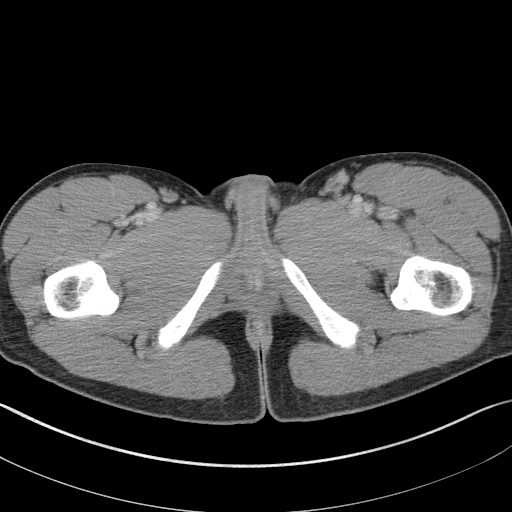
[im 8/96  bone]
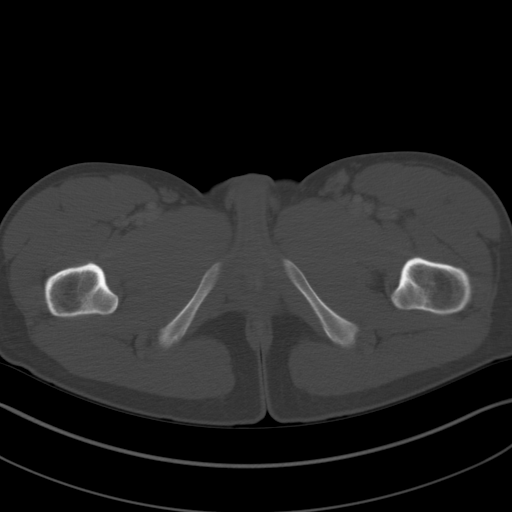
[im 20/96  soft-tissue]
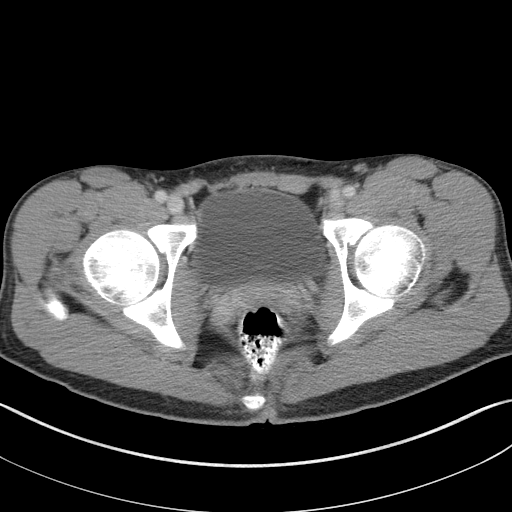
[im 27/96  soft-tissue]
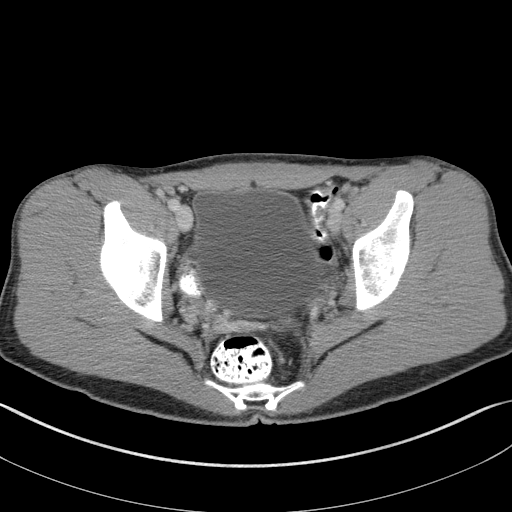
[im 39/96  soft-tissue]
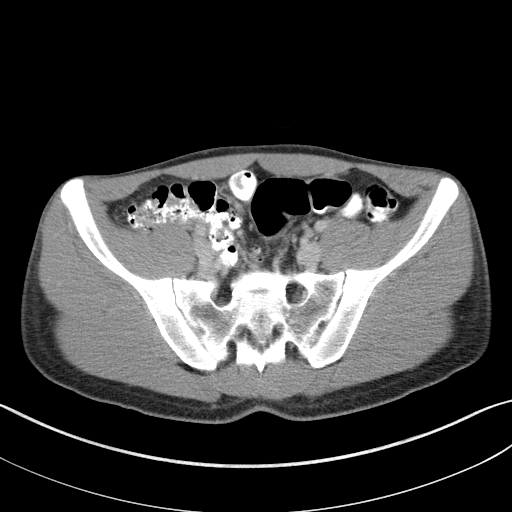
[im 50/96  soft-tissue]
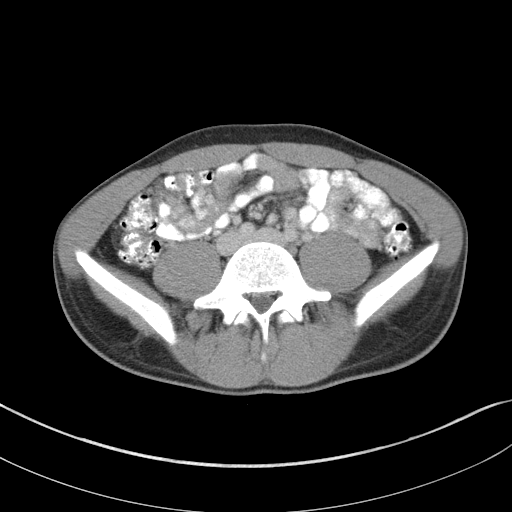
[im 58/96  soft-tissue]
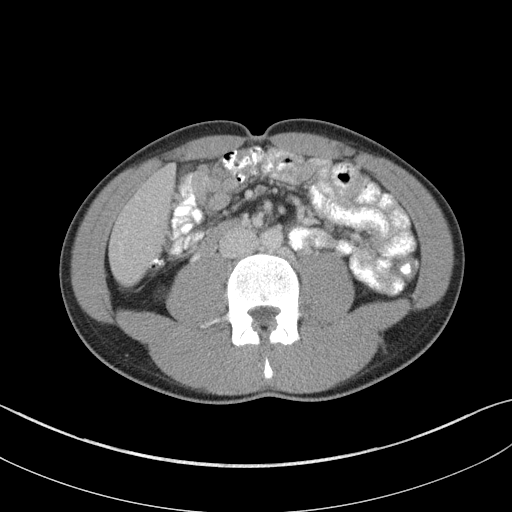
[im 69/96  soft-tissue]
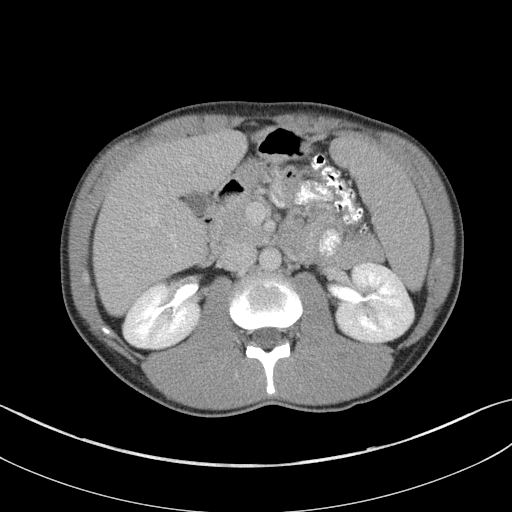
[im 80/96  soft-tissue]
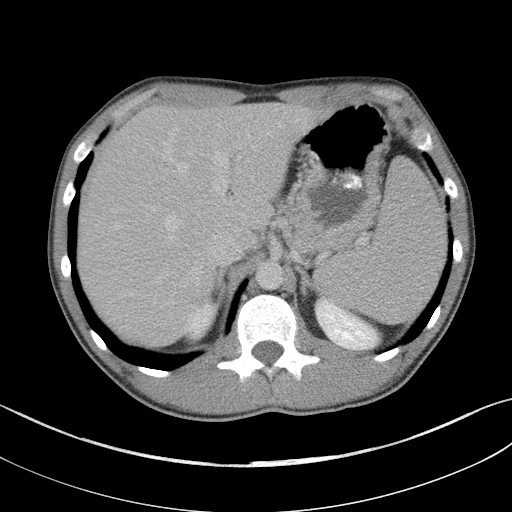
[im 80/96  lung]
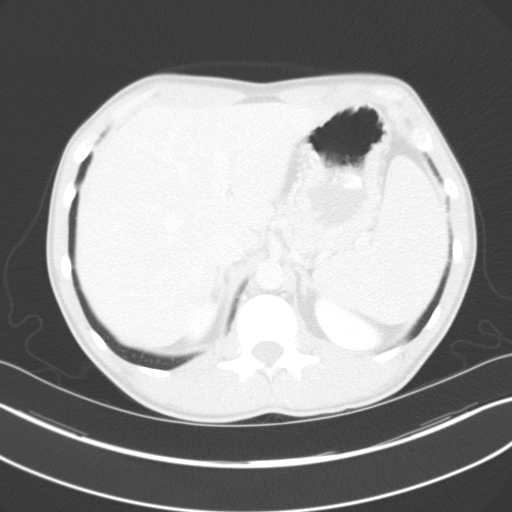
[im 84/96  lung]
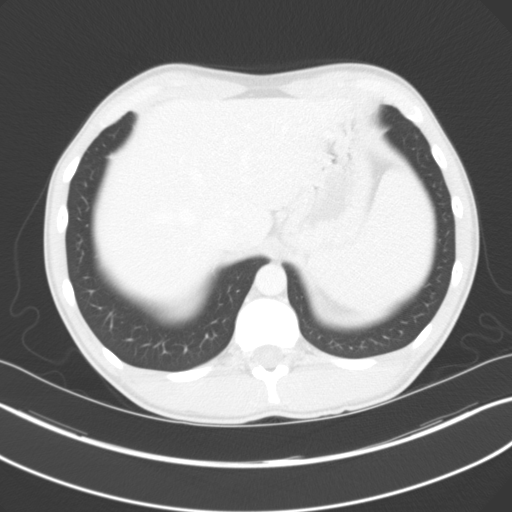
[im 88/96  soft-tissue]
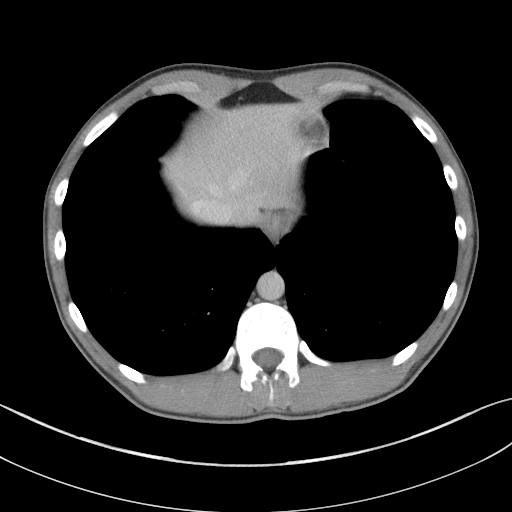
[im 88/96  lung]
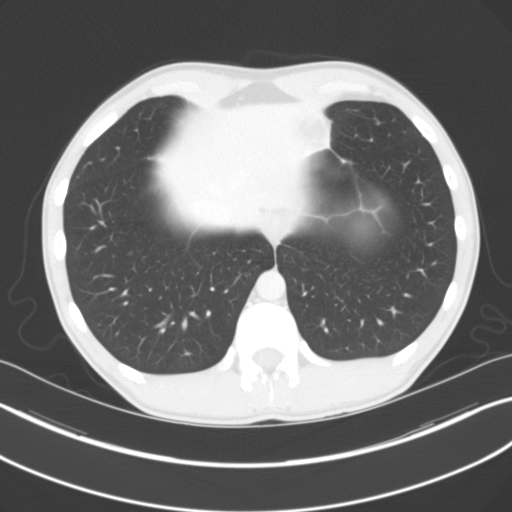
[im 88/96  bone]
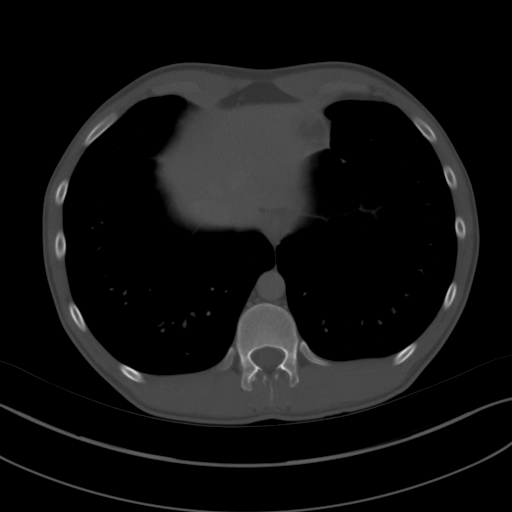
[im 92/96  lung]
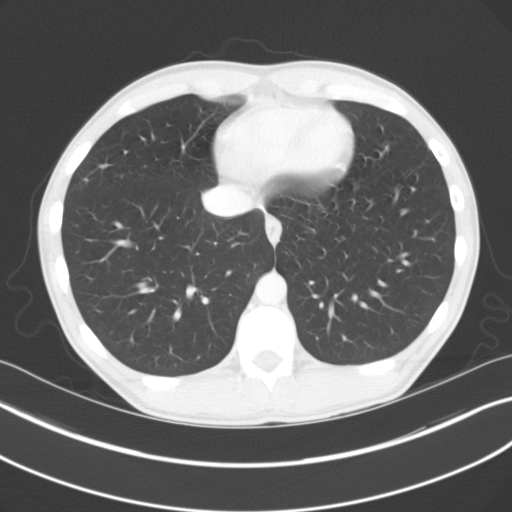

[Series 6: a/p with 3.0 coronal · coronal · 0.79mm/px · 3 of 80 slices shown]
[im 20/80  soft-tissue]
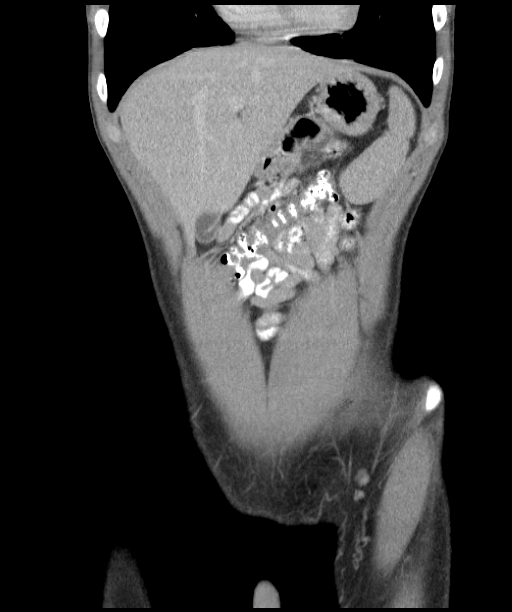
[im 40/80  soft-tissue]
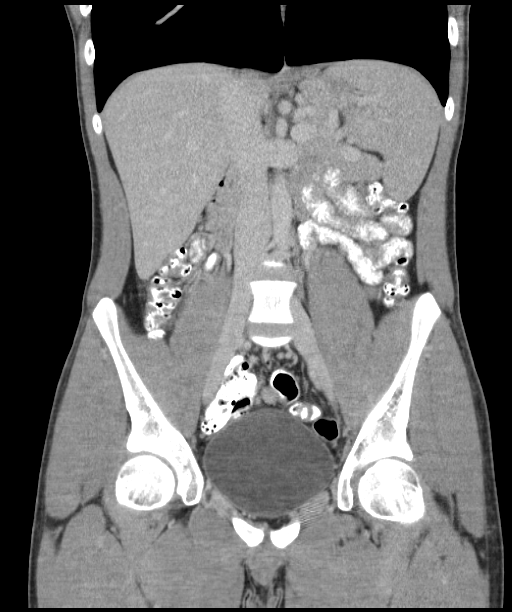
[im 60/80  soft-tissue]
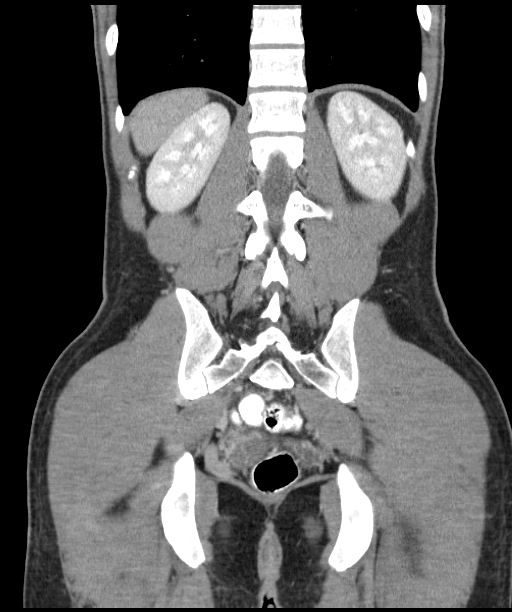

[14 of 46 positions shown; findings below may reference images not displayed]

FINDINGS: Vascular structures:

The portal venous system is patent. The IVC and renal veins are
patent. There is a small amount of nonocclusive thrombus in the left
common femoral vein. No significant thrombus identified in the left
iliac veins. There is compression of the left common iliac vein from
the right common iliac artery but this is a normal anatomic finding.
The right iliac veins are patent. There may be a small focus of
nonocclusive thrombus in the right femoral vein on sequence 3, image
91.

Normal caliber of the abdominal aorta and iliac arteries without
atherosclerotic calcifications.

NONVASCULAR FINDINGS:

Lung bases are clear. Negative for free intraperitoneal air. Normal
appearance of the liver, gallbladder, pancreas and adrenal glands.
No gross abnormality to kidneys. Spleen is slightly prominent for
size measuring 10.9 x 12.3 x 8.8 cm. Fluid in the urinary bladder.
Normal appearance of the prostate. Moderate amount of stool
throughout the colon which also contains contrast. Cecum extends
into the pelvis. Normal appearance of small bowel. No significant
free fluid or lymphadenopathy. No acute bone abnormality.
IMPRESSION: Small amount of nonocclusive thrombus in the left common femoral
vein and question a small amount of nonocclusive thrombus in the
right femoral vein. There is no thrombus within the iliac veins.
There is compression of the left common iliac vein which is a normal
variant but no evidence to suggest Marjan Maky Remetin syndrome.

Spleen size is slightly prominent.

## 2016-07-02 ENCOUNTER — Other Ambulatory Visit: Payer: Self-pay | Admitting: *Deleted

## 2016-07-02 DIAGNOSIS — I82402 Acute embolism and thrombosis of unspecified deep veins of left lower extremity: Secondary | ICD-10-CM

## 2016-07-02 MED ORDER — APIXABAN 5 MG PO TABS: 5.0000 mg | ORAL_TABLET | Freq: Two times a day (BID) | ORAL | 1 refills | Status: DC

## 2016-08-28 IMAGING — US US EXTREM LOW VENOUS*L*
1 series · 13 of 24 positions shown · non-contrast
Comparison: 03/27/2015

CLINICAL DATA: Follow-up left lower extremity DVT.

EXAM:
LEFT LOWER EXTREMITY VENOUS DOPPLER ULTRASOUND
TECHNIQUE: Gray-scale sonography with graded compression, as well as color
Doppler and duplex ultrasound, were performed to evaluate the deep
venous system from the level of the common femoral vein through the
popliteal and proximal calf veins. Spectral Doppler was utilized to
evaluate flow at rest and with distal augmentation maneuvers.

[Series 1: us extrem low venous*left* · 0.05mm/px · 13 of 30 slices shown]
[im 1/30]
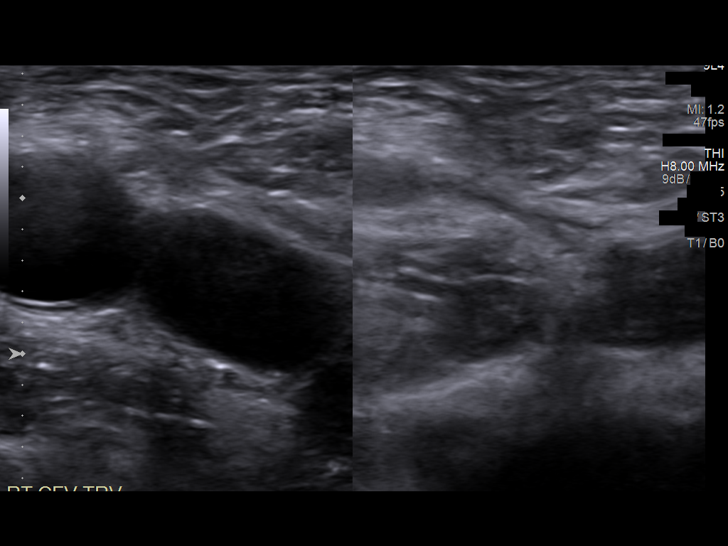
[im 3/30]
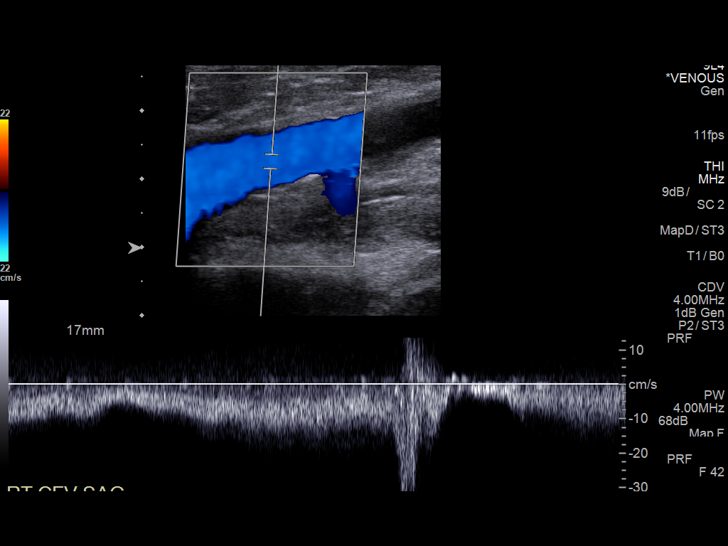
[im 6/30]
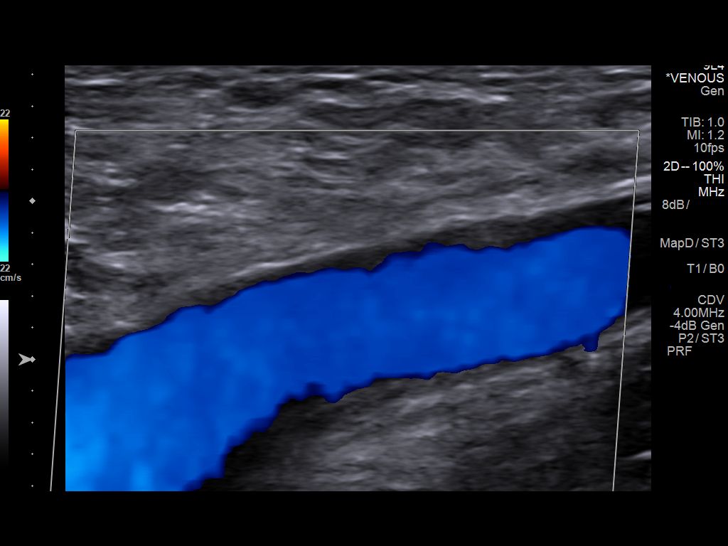
[im 8/30]
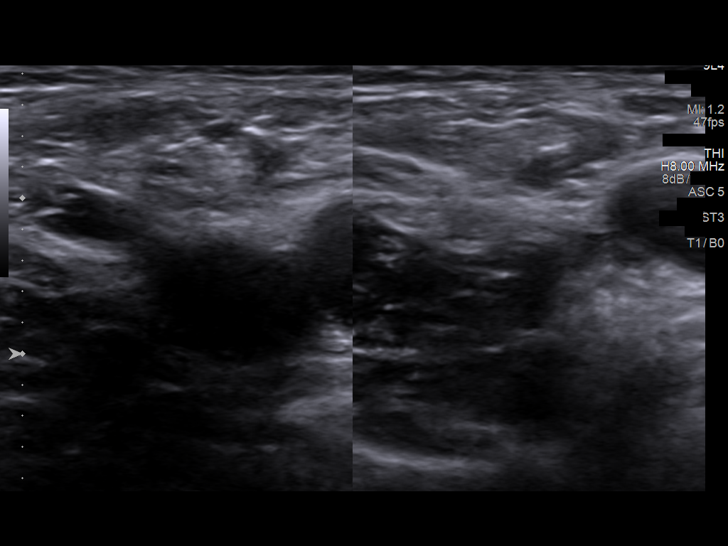
[im 11/30]
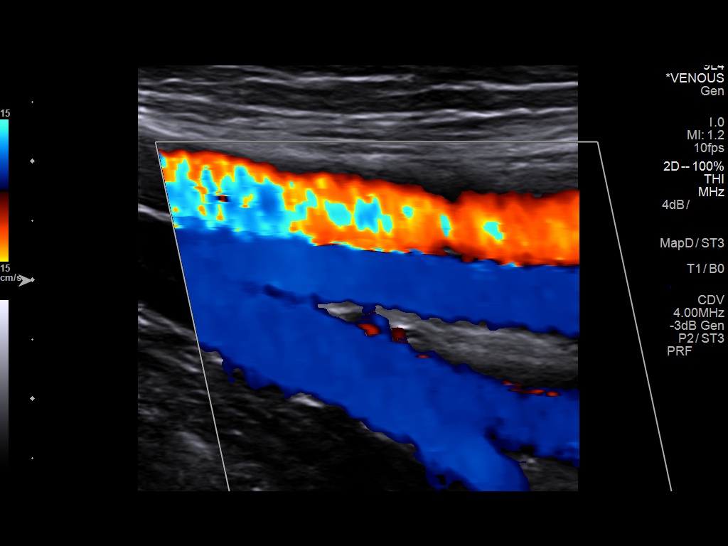
[im 13/30]
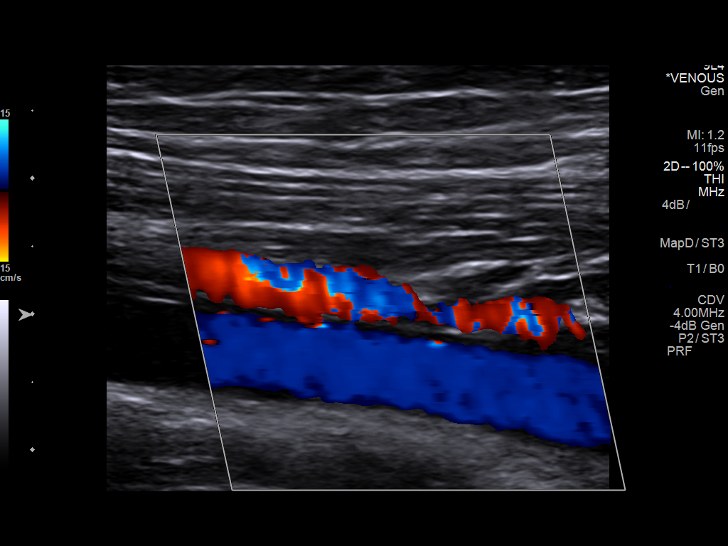
[im 16/30]
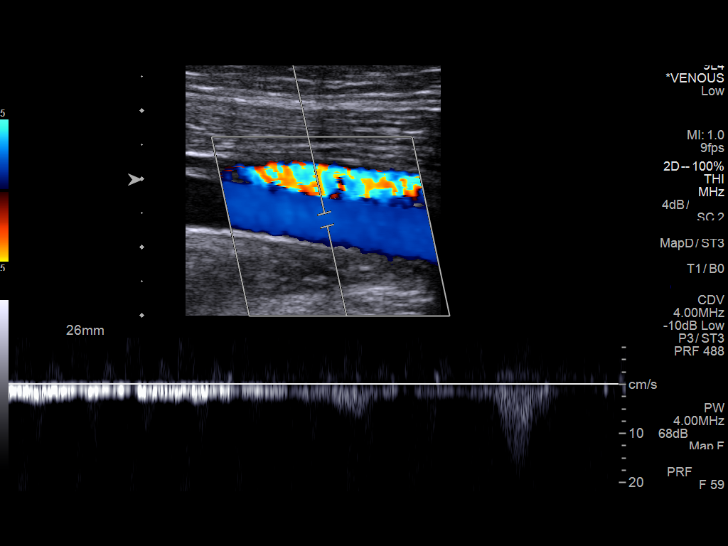
[im 17/30]
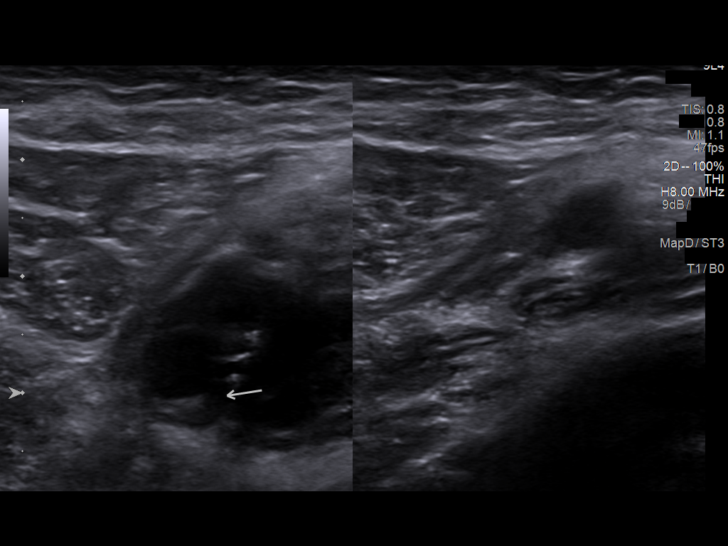
[im 19/30]
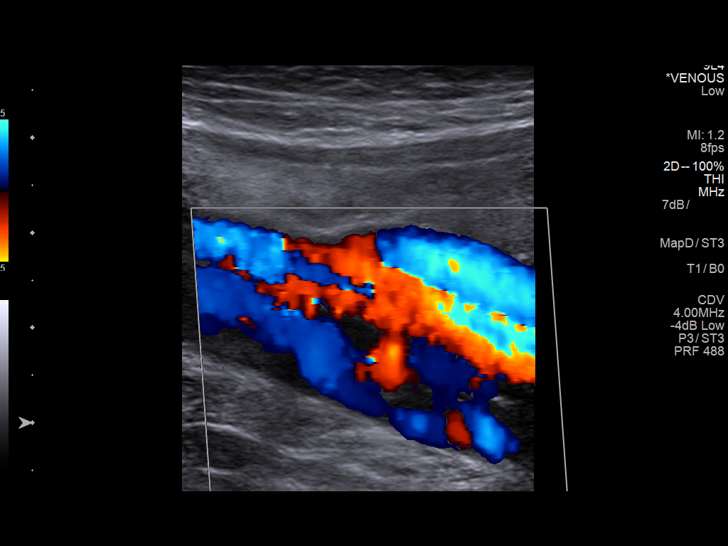
[im 22/30]
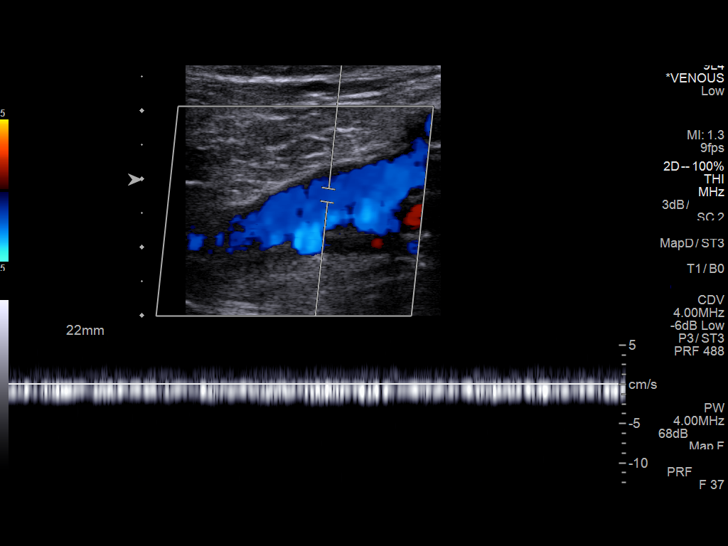
[im 24/30]
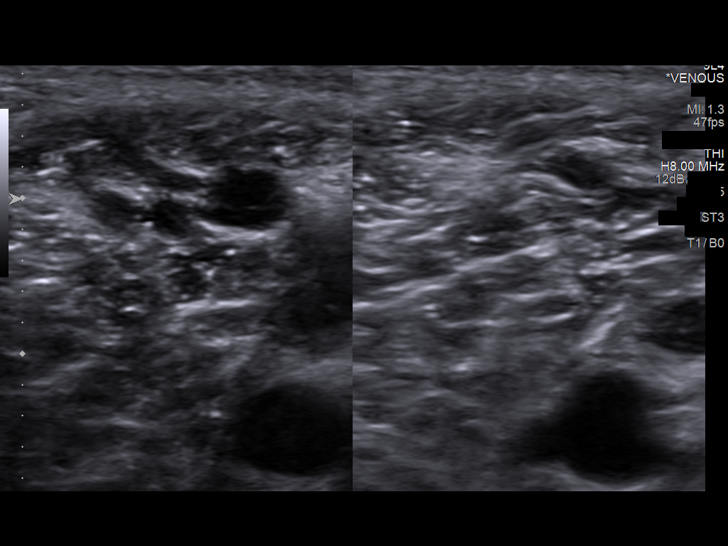
[im 27/30]
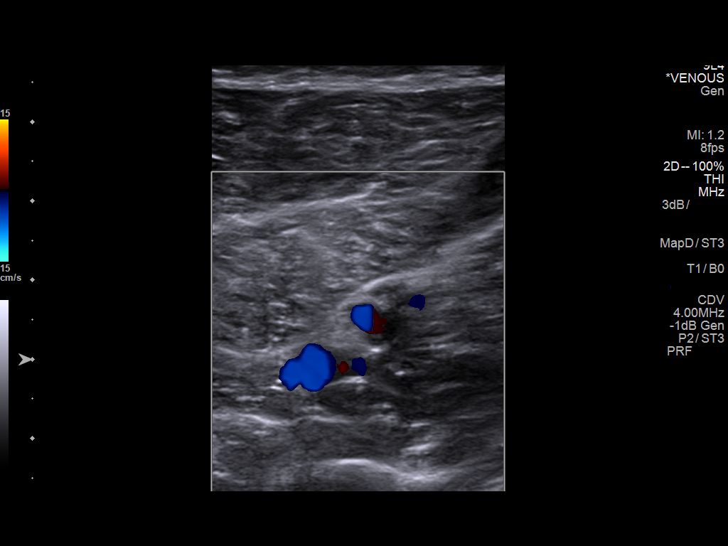
[im 30/30]
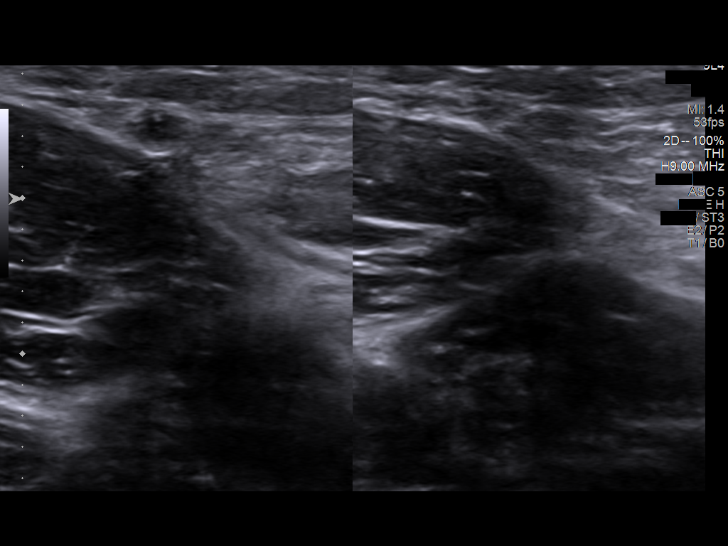

[13 of 24 positions shown; findings below may reference images not displayed]

FINDINGS: Right common femoral vein is patent without thrombus.

Left common femoral vein is compressible without acute thrombus.
Small amount of wall thickening in the left common femoral vein
could be related to old thrombus and present on the previous
examination. Normal color Doppler flow and augmentation in left
common femoral vein. The left saphenofemoral junction is patent.
Left profunda femoral vein is patent without thrombus. Normal
compressibility and color Doppler flow in the proximal and mid left
femoral vein. Partial compressibility with chronic thrombus in the
distal left femoral vein. This chronic thrombus is nonocclusive.
Partial compressibility of the left popliteal vein with chronic
echogenic thrombus. This chronic popliteal vein thrombus is
nonocclusive. Increased recanalization of the distal femoral vein
and popliteal vein compared to the previous examination. Visualized
left deep calf veins are patent without thrombus. Left great
saphenous vein is patent without thrombus.
IMPRESSION: Negative for an acute deep vein thrombosis in the left lower
extremity.

Chronic nonocclusive thrombus in the left popliteal vein and distal
left femoral vein. Increased flow and recanalization within the left
popliteal vein and distal left femoral vein compared to the previous
examination.

## 2016-09-29 ENCOUNTER — Other Ambulatory Visit: Payer: Self-pay | Admitting: Hematology & Oncology

## 2016-09-29 DIAGNOSIS — I82402 Acute embolism and thrombosis of unspecified deep veins of left lower extremity: Secondary | ICD-10-CM

## 2016-10-17 ENCOUNTER — Ambulatory Visit: Payer: BLUE CROSS/BLUE SHIELD | Admitting: Hematology & Oncology

## 2016-10-17 ENCOUNTER — Other Ambulatory Visit: Payer: BLUE CROSS/BLUE SHIELD

## 2016-10-24 ENCOUNTER — Other Ambulatory Visit (HOSPITAL_BASED_OUTPATIENT_CLINIC_OR_DEPARTMENT_OTHER): Payer: BLUE CROSS/BLUE SHIELD

## 2016-10-24 ENCOUNTER — Ambulatory Visit (HOSPITAL_BASED_OUTPATIENT_CLINIC_OR_DEPARTMENT_OTHER): Payer: BLUE CROSS/BLUE SHIELD | Admitting: Hematology & Oncology

## 2016-10-24 VITALS — BP 131/82 | HR 67 | Temp 98.1°F | Resp 18 | Wt 182.0 lb

## 2016-10-24 DIAGNOSIS — I82412 Acute embolism and thrombosis of left femoral vein: Secondary | ICD-10-CM

## 2016-10-24 DIAGNOSIS — Z7901 Long term (current) use of anticoagulants: Secondary | ICD-10-CM

## 2016-10-24 DIAGNOSIS — I82402 Acute embolism and thrombosis of unspecified deep veins of left lower extremity: Secondary | ICD-10-CM | POA: Diagnosis not present

## 2016-10-24 DIAGNOSIS — I82419 Acute embolism and thrombosis of unspecified femoral vein: Secondary | ICD-10-CM

## 2016-10-24 DIAGNOSIS — D6862 Lupus anticoagulant syndrome: Secondary | ICD-10-CM

## 2016-10-24 LAB — CBC WITH DIFFERENTIAL (CANCER CENTER ONLY)
BASO#: 0 10*3/uL (ref 0.0–0.2)
BASO%: 0.2 % (ref 0.0–2.0)
EOS ABS: 0 10*3/uL (ref 0.0–0.5)
EOS%: 0.7 % (ref 0.0–7.0)
HCT: 40.7 % (ref 38.7–49.9)
HEMOGLOBIN: 13.8 g/dL (ref 13.0–17.1)
LYMPH#: 1.3 10*3/uL (ref 0.9–3.3)
LYMPH%: 21.7 % (ref 14.0–48.0)
MCH: 31.7 pg (ref 28.0–33.4)
MCHC: 33.9 g/dL (ref 32.0–35.9)
MCV: 94 fL (ref 82–98)
MONO#: 0.5 10*3/uL (ref 0.1–0.9)
MONO%: 8.1 % (ref 0.0–13.0)
NEUT%: 69.3 % (ref 40.0–80.0)
NEUTROS ABS: 4.1 10*3/uL (ref 1.5–6.5)
Platelets: 209 10*3/uL (ref 145–400)
RBC: 4.35 10*6/uL (ref 4.20–5.70)
RDW: 12.8 % (ref 11.1–15.7)
WBC: 5.9 10*3/uL (ref 4.0–10.0)

## 2016-10-24 LAB — CMP (CANCER CENTER ONLY)
ALBUMIN: 3.6 g/dL (ref 3.3–5.5)
ALK PHOS: 81 U/L (ref 26–84)
ALT(SGPT): 22 U/L (ref 10–47)
AST: 19 U/L (ref 11–38)
BUN: 10 mg/dL (ref 7–22)
CHLORIDE: 107 meq/L (ref 98–108)
CO2: 28 meq/L (ref 18–33)
Calcium: 9.7 mg/dL (ref 8.0–10.3)
Creat: 1.2 mg/dl (ref 0.6–1.2)
GLUCOSE: 88 mg/dL (ref 73–118)
POTASSIUM: 4 meq/L (ref 3.3–4.7)
Sodium: 142 mEq/L (ref 128–145)
Total Bilirubin: 0.7 mg/dl (ref 0.20–1.60)
Total Protein: 7.2 g/dL (ref 6.4–8.1)

## 2016-10-24 NOTE — Progress Notes (Signed)
Hematology and Oncology Follow Up Visit  Bryan Robbins [id number] 1995/10/24 21 y.o. 10/24/2016   Principle Diagnosis:   Left lower extremity thromboembolic disease  Positive lupus anticoagulant  Current Therapy:    ELIQUIS 5 mg by mouth twice a day - lifelong  Aspirin 81 mg by mouth daily - lifelong     Interim History:  Bryan Robbins is back for follow-up. He is doing great. He is in his final semester of school at [employer]. Hopefully he will graduate in December and get a job locally.  He has had no problems with the ELIQUIS or aspirin. It has been almost 3 years since he first had his blood clot. He then had recurrence. This is why he is on lifelong anticoagulation.  His up as an anticoagulant is intermittent. However, when this recurs, he does tend to be hypercoagulable.  He's had no cough or shortness of breath. He's working out. He is running. He's not having any pain or swelling in the left leg. He's had no change in bowel or bladder habits. He's had no nausea or vomiting.   Overall, his performance status is ECOG 0   Medications:  Current Outpatient Prescriptions:  .  aspirin 81 MG tablet, Take 81 mg by mouth daily., Disp: , Rfl:  .  ELIQUIS 5 MG TABS tablet, TAKE 1 TABLET (5 MG TOTAL) BY MOUTH 2 (TWO) TIMES DAILY, Disp: 180 tablet, Rfl: 1  Allergies:  Allergies  Allergen Reactions  . Penicillin G Rash  . Penicillins Hives    Past Medical History, Surgical history, Social history, and Family History were reviewed and updated.  Review of Systems: As stated in the interim history  Physical Exam:  weight is 182 lb (82.6 kg). His oral temperature is 98.1 F (36.7 C). His blood pressure is 131/82 and his pulse is 67. His respiration is 18 and oxygen saturation is 100%.   Wt Readings from Last 3 Encounters:  10/24/16 182 lb (82.6 kg)  04/18/16 178 lb (80.7 kg)  02/22/16 181 lb 6.4 oz (82.3 kg)     Physical Exam  Constitutional: He is oriented to person, place,  and time.  HENT:  Head: Normocephalic and atraumatic.  Mouth/Throat: Oropharynx is clear and moist.  Eyes: Pupils are equal, round, and reactive to light. EOM are normal.  Neck: Normal range of motion.  Cardiovascular: Normal rate, regular rhythm and normal heart sounds.   Pulmonary/Chest: Effort normal and breath sounds normal.  Abdominal: Soft. Bowel sounds are normal.  Musculoskeletal: Normal range of motion. He exhibits no edema, tenderness or deformity.  Lymphadenopathy:    He has no cervical adenopathy.  Neurological: He is alert and oriented to person, place, and time.  Skin: Skin is warm and dry. No rash noted. No erythema.  Psychiatric: He has a normal mood and affect. His behavior is normal. Judgment and thought content normal.  Vitals reviewed.    Lab Results  Component Value Date   WBC 5.9 10/24/2016   HGB 13.8 10/24/2016   HCT 40.7 10/24/2016   MCV 94 10/24/2016   PLT 209 10/24/2016     Chemistry      Component Value Date/Time   NA 142 10/24/2016 1335   NA 138 01/07/2016 0928   K 4.0 10/24/2016 1335   K 4.1 01/07/2016 0928   CL 107 10/24/2016 1335   CO2 28 10/24/2016 1335   CO2 25 01/07/2016 0928   BUN 10 10/24/2016 1335   BUN 12.3 01/07/2016 0928  CREATININE 1.2 10/24/2016 1335   CREATININE 1.2 01/07/2016 0928      Component Value Date/Time   CALCIUM 9.7 10/24/2016 1335   CALCIUM 9.8 01/07/2016 0928   ALKPHOS 81 10/24/2016 1335   ALKPHOS 89 01/07/2016 0928   AST 19 10/24/2016 1335   AST 13 01/07/2016 0928   ALT 22 10/24/2016 1335   ALT 16 01/07/2016 0928   BILITOT 0.70 10/24/2016 1335   BILITOT 0.74 01/07/2016 0928         Impression and Plan: Bryan Robbins is a 21 year old white male. He has a thrombus in the left leg. He does have a positive lupus anticoagulant. I told him that it is probable that the lupus anticoagulant will be intermittent. However, I think he probably will need lifelong anticoagulation.   Since he is doing so well, I  really think we can hold off on seeing him on a regular basis. He knows that he can always call us and we can get him in.  He will graduate from [employer] in December. Hopefully, he will get a job locally. I'm sure that he will have no problems getting a job.  I told him that he can call us if he has any problems with leg swelling, bleeding, cough or shortness of breath, chest wall pain, etc.   Bryan Macho, MD 10/5/20182:19 PM

## 2016-10-25 LAB — D-DIMER, QUANTITATIVE: D-DIMER: <0.2 mg/L FEU (ref 0.00–0.49)

## 2016-10-28 LAB — LUPUS ANTICOAGULANT PANEL
PTT-LA: 45.4 s (ref 0.0–51.9)
dRVVT Mix: 44.5 s (ref 0.0–47.0)
dRVVT: 72.8 s — ABNORMAL HIGH (ref 0.0–47.0)

## 2016-11-27 ENCOUNTER — Encounter: Payer: Self-pay | Admitting: Family Medicine

## 2017-06-21 ENCOUNTER — Other Ambulatory Visit: Payer: Self-pay | Admitting: Hematology & Oncology

## 2017-06-21 DIAGNOSIS — I82402 Acute embolism and thrombosis of unspecified deep veins of left lower extremity: Secondary | ICD-10-CM

## 2017-10-13 NOTE — Telephone Encounter (Signed)
Incorrect encounter

## 2017-12-16 ENCOUNTER — Other Ambulatory Visit: Payer: Self-pay | Admitting: Hematology & Oncology

## 2017-12-16 DIAGNOSIS — I82402 Acute embolism and thrombosis of unspecified deep veins of left lower extremity: Secondary | ICD-10-CM

## 2018-03-12 ENCOUNTER — Other Ambulatory Visit: Payer: Self-pay | Admitting: *Deleted

## 2018-03-12 DIAGNOSIS — I82402 Acute embolism and thrombosis of unspecified deep veins of left lower extremity: Secondary | ICD-10-CM

## 2018-03-12 MED ORDER — APIXABAN 5 MG PO TABS: 5.0000 mg | ORAL_TABLET | Freq: Two times a day (BID) | ORAL | 0 refills | Status: DC

## 2018-03-15 ENCOUNTER — Other Ambulatory Visit: Payer: Self-pay | Admitting: Hematology & Oncology

## 2018-03-15 DIAGNOSIS — I82402 Acute embolism and thrombosis of unspecified deep veins of left lower extremity: Secondary | ICD-10-CM

## 2018-04-22 ENCOUNTER — Other Ambulatory Visit: Payer: Self-pay

## 2018-04-22 NOTE — Progress Notes (Signed)
Received call from pt stating that he was seen at an Urgent Care in [facility] yesterday for scant, occasional rectal bleeding and rt groin pain.  Pt was not actively bleeding at the time of visit, but was found to have an inguinal hernia. Instructed to contact our office related to rectal bleeding d/t the requirement for lifelong anticoagulation.  No labs or tests performed at urgent care.   Per Dr Myna Hidalgo, pt can come in to office for labs/NP, however d/t COVID-19, routine visits are being limited to protect immunocompromised patients. Questioned pt how comfort he is coming in or how long he would like to wait to schedule.   Patient questions if he could have labs drawn locally since he lives in [place]. [place] per Dr Myna Hidalgo.  Per pt request, lab order faxed to [facility] on [address] in [place].  Fax # [phone].   dph

## 2018-09-13 ENCOUNTER — Other Ambulatory Visit: Payer: Self-pay | Admitting: Hematology & Oncology

## 2018-09-13 DIAGNOSIS — I82402 Acute embolism and thrombosis of unspecified deep veins of left lower extremity: Secondary | ICD-10-CM

## 2018-09-14 ENCOUNTER — Other Ambulatory Visit: Payer: Self-pay | Admitting: *Deleted

## 2018-09-14 DIAGNOSIS — I82402 Acute embolism and thrombosis of unspecified deep veins of left lower extremity: Secondary | ICD-10-CM

## 2018-09-14 MED ORDER — APIXABAN 5 MG PO TABS: 5.0000 mg | ORAL_TABLET | Freq: Two times a day (BID) | ORAL | 0 refills | Status: DC

## 2019-03-10 ENCOUNTER — Other Ambulatory Visit: Payer: Self-pay | Admitting: *Deleted

## 2019-03-10 DIAGNOSIS — I82402 Acute embolism and thrombosis of unspecified deep veins of left lower extremity: Secondary | ICD-10-CM

## 2019-03-10 MED ORDER — APIXABAN 5 MG PO TABS: 5.0000 mg | ORAL_TABLET | Freq: Two times a day (BID) | ORAL | 0 refills | Status: AC

## 2019-04-13 ENCOUNTER — Telehealth: Payer: Self-pay | Admitting: Hematology & Oncology

## 2019-04-13 NOTE — Telephone Encounter (Signed)
Called and LMVM for patient regarding appointments being cancelled per his request.  He has since moved to [place] and per [place] he would need to check with his insurance to see about locating a Hematoligist there for his continued care. I left my number [phone] in case he had any questions

## 2019-04-21 ENCOUNTER — Other Ambulatory Visit: Payer: BLUE CROSS/BLUE SHIELD

## 2019-04-21 ENCOUNTER — Ambulatory Visit: Payer: BLUE CROSS/BLUE SHIELD | Admitting: Hematology & Oncology
